# Patient Record
Sex: Female | Born: 1950 | Race: White | Hispanic: No | Marital: Married | Smoking: Never smoker
Health system: Southern US, Community
[De-identification: ages and names within clinical notes are randomized; demographics above are authoritative.]

## PROBLEM LIST (undated history)

## (undated) DIAGNOSIS — I479 Paroxysmal tachycardia, unspecified: Secondary | ICD-10-CM

## (undated) DIAGNOSIS — I10 Essential (primary) hypertension: Secondary | ICD-10-CM

## (undated) DIAGNOSIS — C4491 Basal cell carcinoma of skin, unspecified: Secondary | ICD-10-CM

## (undated) DIAGNOSIS — M797 Fibromyalgia: Secondary | ICD-10-CM

## (undated) DIAGNOSIS — G4700 Insomnia, unspecified: Secondary | ICD-10-CM

## (undated) DIAGNOSIS — T8859XA Other complications of anesthesia, initial encounter: Secondary | ICD-10-CM

## (undated) DIAGNOSIS — N952 Postmenopausal atrophic vaginitis: Secondary | ICD-10-CM

## (undated) HISTORY — DX: Postmenopausal atrophic vaginitis: N95.2

## (undated) HISTORY — PX: COLPOSCOPY: SHX161

## (undated) HISTORY — DX: Basal cell carcinoma of skin, unspecified: C44.91

## (undated) HISTORY — DX: Fibromyalgia: M79.7

## (undated) HISTORY — DX: Paroxysmal tachycardia, unspecified: I47.9

## (undated) HISTORY — DX: Insomnia, unspecified: G47.00

## (undated) HISTORY — PX: BASAL CELL CARCINOMA EXCISION: SHX1214

---
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
## 1994-01-21 HISTORY — PX: REFRACTIVE SURGERY: SHX103

## 2002-01-21 HISTORY — PX: BLEPHAROPLASTY: SUR158

## 2003-01-22 HISTORY — PX: ENDOMETRIAL ABLATION: SHX621

## 2003-03-08 ENCOUNTER — Encounter: Admission: RE | Admit: 2003-03-08 | Discharge: 2003-03-08 | Payer: Self-pay | Admitting: Obstetrics and Gynecology

## 2003-10-13 ENCOUNTER — Ambulatory Visit (HOSPITAL_BASED_OUTPATIENT_CLINIC_OR_DEPARTMENT_OTHER): Admission: RE | Admit: 2003-10-13 | Discharge: 2003-10-13 | Payer: Self-pay | Admitting: Obstetrics and Gynecology

## 2003-12-23 ENCOUNTER — Ambulatory Visit (HOSPITAL_COMMUNITY): Admission: RE | Admit: 2003-12-23 | Discharge: 2003-12-23 | Payer: Self-pay | Admitting: Gastroenterology

## 2003-12-23 ENCOUNTER — Encounter (INDEPENDENT_AMBULATORY_CARE_PROVIDER_SITE_OTHER): Payer: Self-pay | Admitting: *Deleted

## 2004-05-07 ENCOUNTER — Encounter: Admission: RE | Admit: 2004-05-07 | Discharge: 2004-05-07 | Payer: Self-pay | Admitting: Obstetrics and Gynecology

## 2005-04-26 ENCOUNTER — Encounter: Admission: RE | Admit: 2005-04-26 | Discharge: 2005-04-26 | Payer: Self-pay | Admitting: Family Medicine

## 2005-07-01 ENCOUNTER — Encounter: Admission: RE | Admit: 2005-07-01 | Discharge: 2005-07-01 | Payer: Self-pay | Admitting: Ophthalmology

## 2005-07-02 ENCOUNTER — Encounter: Admission: RE | Admit: 2005-07-02 | Discharge: 2005-07-02 | Payer: Self-pay | Admitting: Obstetrics and Gynecology

## 2007-05-22 DIAGNOSIS — N952 Postmenopausal atrophic vaginitis: Secondary | ICD-10-CM

## 2007-05-22 HISTORY — DX: Postmenopausal atrophic vaginitis: N95.2

## 2007-06-01 ENCOUNTER — Other Ambulatory Visit: Admission: RE | Admit: 2007-06-01 | Discharge: 2007-06-01 | Payer: Self-pay | Admitting: Obstetrics and Gynecology

## 2007-06-22 ENCOUNTER — Encounter: Admission: RE | Admit: 2007-06-22 | Discharge: 2007-06-22 | Payer: Self-pay | Admitting: Obstetrics and Gynecology

## 2008-07-11 HISTORY — PX: COLPOSCOPY W/ BIOPSY / CURETTAGE: SUR283

## 2008-07-18 ENCOUNTER — Encounter: Admission: RE | Admit: 2008-07-18 | Discharge: 2008-07-18 | Payer: Self-pay | Admitting: Obstetrics & Gynecology

## 2009-08-01 ENCOUNTER — Encounter: Admission: RE | Admit: 2009-08-01 | Discharge: 2009-08-01 | Payer: Self-pay | Admitting: Obstetrics & Gynecology

## 2009-08-08 ENCOUNTER — Encounter: Admission: RE | Admit: 2009-08-08 | Discharge: 2009-08-08 | Payer: Self-pay | Admitting: Obstetrics & Gynecology

## 2009-12-06 LAB — HM COLONOSCOPY

## 2010-02-11 ENCOUNTER — Encounter: Payer: Self-pay | Admitting: Obstetrics & Gynecology

## 2010-06-08 NOTE — Op Note (Signed)
NAMEMONTRICE, MONTUORI                 ACCOUNT NO.:  [id number]   MEDICAL RECORD NO.:  [id number]          PATIENT TYPE:  AMB   LOCATION:  [facility]                         FACILITY:  [facility]   PHYSICIAN:  Katherine Roan, M.D.  DATE OF BIRTH:  Jun 15, 1950   DATE OF PROCEDURE:  10/13/2003  DATE OF DISCHARGE:                                 OPERATIVE REPORT   PREOPERATIVE DIAGNOSIS:  Menorrhagia.   POSTOPERATIVE DIAGNOSIS:  Menorrhagia.   OPERATION:  Pelvic examination under anesthesia, cervical dilatation,  hysteroscopy with hydrothermal ablation.   The patient was placed in the lithotomy position.  General anesthesia  instituted.  Exam under anesthesia revealed an anterior, slightly enlarged  uterus with no adnexal masses.  Prep and drape in the usual fashion.  The  bladder was emptied.  The cervix was then grasped with a tenaculum and  dilated to a #25 dilator, and the hysteroscope was inserted into the uterus.  The uterine cavity was smooth.  There were 2-3 small polyps.  We had noticed  these on the sonohystogram.  Then a hydrothermal ablation was accomplished  without difficulty.  Pictures were taken pre- and postop.  Ms. Hogston  tolerated this procedure well.      SDM/MEDQ  D:  10/13/2003  T:  10/13/2003  Job:  [id number]

## 2010-08-27 ENCOUNTER — Other Ambulatory Visit: Payer: Self-pay | Admitting: Obstetrics & Gynecology

## 2010-08-27 DIAGNOSIS — Z1231 Encounter for screening mammogram for malignant neoplasm of breast: Secondary | ICD-10-CM

## 2010-09-05 ENCOUNTER — Ambulatory Visit
Admission: RE | Admit: 2010-09-05 | Discharge: 2010-09-05 | Disposition: A | Discharge status: Home / Self Care | Payer: BC Managed Care – PPO | Source: Ambulatory Visit | Attending: Obstetrics & Gynecology | Admitting: Obstetrics & Gynecology

## 2010-09-05 DIAGNOSIS — Z1231 Encounter for screening mammogram for malignant neoplasm of breast: Secondary | ICD-10-CM

## 2011-08-01 ENCOUNTER — Other Ambulatory Visit: Payer: Self-pay | Admitting: Obstetrics & Gynecology

## 2011-08-01 DIAGNOSIS — Z1231 Encounter for screening mammogram for malignant neoplasm of breast: Secondary | ICD-10-CM

## 2011-09-09 ENCOUNTER — Ambulatory Visit
Admission: RE | Admit: 2011-09-09 | Discharge: 2011-09-09 | Disposition: A | Discharge status: Home / Self Care | Payer: BC Managed Care – PPO | Source: Ambulatory Visit | Attending: Obstetrics & Gynecology | Admitting: Obstetrics & Gynecology

## 2011-09-09 DIAGNOSIS — Z1231 Encounter for screening mammogram for malignant neoplasm of breast: Secondary | ICD-10-CM

## 2011-10-11 ENCOUNTER — Other Ambulatory Visit: Payer: Self-pay | Admitting: Physician Assistant

## 2012-04-28 ENCOUNTER — Telehealth: Payer: Self-pay | Admitting: Nurse Practitioner

## 2012-04-28 ENCOUNTER — Other Ambulatory Visit: Payer: Self-pay | Admitting: *Deleted

## 2012-04-28 MED ORDER — ESTRADIOL 0.1 MG/GM VA CREA: 2.0000 g | TOPICAL_CREAM | Freq: Every day | VAGINAL | Status: DC

## 2012-04-28 NOTE — Telephone Encounter (Signed)
OK to refill through September 2014

## 2012-04-28 NOTE — Telephone Encounter (Signed)
PT WOULD LIKE A REFILL ON THE ESTRACE CREAM SENT TO [facility] AT [phone].

## 2012-05-03 ENCOUNTER — Other Ambulatory Visit: Payer: Self-pay | Admitting: Nurse Practitioner

## 2012-07-01 ENCOUNTER — Ambulatory Visit (INDEPENDENT_AMBULATORY_CARE_PROVIDER_SITE_OTHER): Payer: BC Managed Care – PPO | Admitting: Nurse Practitioner

## 2012-07-01 ENCOUNTER — Telehealth: Payer: Self-pay | Admitting: Nurse Practitioner

## 2012-07-01 ENCOUNTER — Encounter: Payer: Self-pay | Admitting: Nurse Practitioner

## 2012-07-01 VITALS — BP 118/70 | HR 64 | Resp 14 | Ht 66.75 in | Wt 151.0 lb

## 2012-07-01 DIAGNOSIS — R102 Pelvic and perineal pain: Secondary | ICD-10-CM

## 2012-07-01 DIAGNOSIS — N949 Unspecified condition associated with female genital organs and menstrual cycle: Secondary | ICD-10-CM

## 2012-07-01 LAB — POCT URINALYSIS DIPSTICK
Leukocytes, UA: NEGATIVE
Urobilinogen, UA: NEGATIVE

## 2012-07-01 MED ORDER — CEPHALEXIN 500 MG PO CAPS: 500.0000 mg | ORAL_CAPSULE | Freq: Two times a day (BID) | ORAL | Status: DC

## 2012-07-01 NOTE — Progress Notes (Signed)
Subjective:     Patient ID: MALAI LADY, female   DOB: 09/26/1950, 63 y.o.   MRN: [id number]  HPI 62 yo WM Fe with history of chafing and irritation on left vulva area for past few days. She feels like it got irritated after shaving.  Then noted a cyst area with slight discoloration. Tender to palpation without exudate.  No recent trauma or aggressive sexual intercourse. No urinary symptoms. She does experience some lower pelvic pressure.   Review of Systems  Constitutional: Positive for chills. Negative for fever and fatigue.  Respiratory: Negative.   Cardiovascular: Negative.   Gastrointestinal: Negative.  Negative for nausea, vomiting, abdominal pain and diarrhea.  Genitourinary: Positive for vaginal pain. Negative for dysuria, urgency, frequency, flank pain, difficulty urinating and pelvic pain.  Neurological: Negative for dizziness, syncope and numbness.  Psychiatric/Behavioral: Negative.        Objective:   Physical Exam  Constitutional: She is oriented to person, place, and time. She appears well-developed and well-nourished.  Cardiovascular: Normal rate.   Pulmonary/Chest: Effort normal.  Abdominal: Soft. She exhibits no distension and no mass. There is no tenderness. There is no rebound and no guarding.  Genitourinary:     Let vulva with a 1/2 cm sebaceous cyst with a bluish discoloration like a blood blister. Minimal if any exudate that is clear to pink tinged. Very  Slight swelling.  Neurological: She is alert and oriented to person, place, and time.  Psychiatric: She has a normal mood and affect. Her behavior is normal. Judgment and thought content normal.       Assessment:     sebaceous cyst    Plan:     Started on Keflex 500 mg bid for next 5-7 days. Warm compresses and sitz bath prn. If area becomes larger or more painful to let us know.  most likely will resolve on its own.

## 2012-07-01 NOTE — Patient Instructions (Signed)

## 2012-07-01 NOTE — Telephone Encounter (Signed)
Left message on CB# of need to return call to office for appointment today.

## 2012-07-01 NOTE — Telephone Encounter (Signed)
Patient is having some burning and cramping, pt would ike appointment with Madison Carr

## 2012-07-02 ENCOUNTER — Encounter: Payer: Self-pay | Admitting: Nurse Practitioner

## 2012-07-02 MED ORDER — CEPHALEXIN 500 MG PO CAPS: 500.0000 mg | ORAL_CAPSULE | Freq: Two times a day (BID) | ORAL | Status: DC

## 2012-07-02 NOTE — Progress Notes (Signed)
Reviewed personally.  M. Suzanne Penn Grissett, MD.  

## 2012-07-03 LAB — URINE CULTURE: Colony Count: 2000

## 2012-07-06 ENCOUNTER — Telehealth: Payer: Self-pay | Admitting: *Deleted

## 2012-07-06 NOTE — Telephone Encounter (Signed)
Message copied by Osie Bond on Mon Jul 06, 2012 11:36 AM ------      Message from: Ria Comment R      Created: Mon Jul 06, 2012  8:33 AM       Let patient know urine C & S is negative ------

## 2012-07-06 NOTE — Telephone Encounter (Signed)
LVM for pt to return call in regards to lab results.

## 2012-08-28 ENCOUNTER — Other Ambulatory Visit: Payer: Self-pay

## 2012-08-28 DIAGNOSIS — Z1231 Encounter for screening mammogram for malignant neoplasm of breast: Secondary | ICD-10-CM

## 2012-09-13 ENCOUNTER — Other Ambulatory Visit: Payer: Self-pay | Admitting: Nurse Practitioner

## 2012-09-14 ENCOUNTER — Other Ambulatory Visit: Payer: Self-pay

## 2012-09-14 MED ORDER — AMITRIPTYLINE HCL 10 MG PO TABS: 10.0000 mg | ORAL_TABLET | Freq: Every day | ORAL | Status: DC

## 2012-09-14 NOTE — Telephone Encounter (Signed)
I left this patient a message to call back to schedule AEX after 8//29/14. Last one was 09/19/11, was given rx for amitriptyline 3 months/3RF. Is ok to refill this for 3 months, please advise. Chart is on your desk.

## 2012-09-14 NOTE — Telephone Encounter (Signed)
Yes Ok for a 3 month refill

## 2012-09-14 NOTE — Telephone Encounter (Signed)
This refill has been sent to pharmacy. See refill encounter.

## 2012-09-16 ENCOUNTER — Ambulatory Visit
Admission: RE | Admit: 2012-09-16 | Discharge: 2012-09-16 | Disposition: A | Discharge status: Home / Self Care | Payer: BC Managed Care – PPO | Source: Ambulatory Visit

## 2012-09-16 DIAGNOSIS — Z1231 Encounter for screening mammogram for malignant neoplasm of breast: Secondary | ICD-10-CM

## 2012-09-22 ENCOUNTER — Ambulatory Visit (INDEPENDENT_AMBULATORY_CARE_PROVIDER_SITE_OTHER): Payer: BC Managed Care – PPO | Admitting: Nurse Practitioner

## 2012-09-22 ENCOUNTER — Encounter: Payer: Self-pay | Admitting: Nurse Practitioner

## 2012-09-22 ENCOUNTER — Ambulatory Visit: Payer: BC Managed Care – PPO | Admitting: Nurse Practitioner

## 2012-09-22 VITALS — BP 120/70 | HR 68 | Resp 16 | Ht 66.75 in | Wt 149.0 lb

## 2012-09-22 DIAGNOSIS — E559 Vitamin D deficiency, unspecified: Secondary | ICD-10-CM

## 2012-09-22 DIAGNOSIS — Z01419 Encounter for gynecological examination (general) (routine) without abnormal findings: Secondary | ICD-10-CM

## 2012-09-22 DIAGNOSIS — Z0000 Encounter for general adult medical examination without abnormal findings: Secondary | ICD-10-CM

## 2012-09-22 LAB — HEMOGLOBIN, FINGERSTICK: Hemoglobin, fingerstick: 14.1 g/dL (ref 12.0–16.0)

## 2012-09-22 MED ORDER — ESTRADIOL 10 MCG VA TABS: 10.0000 ug | ORAL_TABLET | VAGINAL | Status: DC

## 2012-09-22 MED ORDER — AMITRIPTYLINE HCL 10 MG PO TABS: 10.0000 mg | ORAL_TABLET | Freq: Every day | ORAL | Status: DC

## 2012-09-22 MED ORDER — ESTRADIOL 0.1 MG/GM VA CREA: TOPICAL_CREAM | VAGINAL | Status: DC

## 2012-09-22 MED ORDER — ESTRADIOL-NORETHINDRONE ACET 0.5-0.1 MG PO TABS: 1.0000 | ORAL_TABLET | Freq: Every day | ORAL | Status: DC

## 2012-09-22 NOTE — Progress Notes (Signed)
Patient ID: Madison Carr, female   DOB: [birth year], 62 y.o.   MRN: [id number] 62 y.o. [id number] Married Caucasian Fe here for annual exam.  Daughter who Nurse Anesthetist is expecting second baby in March, having a lot of nausea issues.  Patient's last menstrual period was 11/22/2003.          Sexually active: yes  The current method of family planning is post menopausal status.    Exercising: yes  Gym/ health club routine includes walking on track  and eliptical and tennis.. Smoker:  no  Health Maintenance: Pap:  09/19/11, ASCUS, neg HR HPV MMG:  09/16/12, BI-Rads 1: negative Colonoscopy:  12/07/11, 3 polyp, repeat in 3 years BMD:   6.1/09, osteopenia TDaP:  unknown Labs: HB:   Urine: 1+ leuk's, pH 7.0   reports that she has never smoked. She has never used smokeless tobacco. She reports that she drinks about 2.5 ounces of alcohol per week. She reports that she does not use illicit drugs.  Past Medical History  Diagnosis Date  . Fibromyalgia   . Insomnia   . Basal cell carcinoma     nose  . Postmenopausal atrophic vaginitis 5/09    Past Surgical History  Procedure Laterality Date  . Tonsillectomy and adenoidectomy  1968  . Endometrial ablation  2005  . Basal cell carcinoma excision  2008 ?    nose    Current Outpatient Prescriptions  Medication Sig Dispense Refill  . amitriptyline (ELAVIL) 10 MG tablet Take 1 tablet (10 mg total) by mouth at bedtime.  90 tablet  3  . estradiol (ESTRACE VAGINAL) 0.1 MG/GM vaginal cream APPLY TWICE A WEEK AS DIRECTED  42.5 g  3  . Estradiol (VAGIFEM) 10 MCG TABS vaginal tablet Place 1 tablet (10 mcg total) vaginally 2 (two) times a week.  24 tablet  3  . Estradiol-Norethindrone Acet (ACTIVELLA) 0.5-0.1 MG per tablet Take 1 tablet by mouth daily.  90 tablet  3  . LORazepam (ATIVAN) 1 MG tablet Take 1 mg by mouth as needed.        No current facility-administered medications for this visit.    Family History  Problem Relation Age of Onset  .  Melanoma Mother 25    left leg  . Heart disease Father     macular deg.  . Macular degeneration Father   . Macular degeneration Paternal Aunt   . Macular degeneration Paternal Uncle     ROS:  Pertinent items are noted in HPI.  Otherwise, a comprehensive ROS was negative.  Exam:   BP 120/70  Pulse 68  Resp 16  Ht 5' 6.75" (1.695 m)  Wt 149 lb (67.586 kg)  BMI 23.52 kg/m2  LMP 11/22/2003 Height: 5' 6.75" (169.5 cm)  Ht Readings from Last 3 Encounters:  09/22/12 5' 6.75" (1.695 m)  07/01/12 5' 6.75" (1.695 m)    General appearance: alert, cooperative and appears stated age Head: Normocephalic, without obvious abnormality, atraumatic Neck: no adenopathy, supple, symmetrical, trachea midline and thyroid normal to inspection and palpation Lungs: clear to auscultation bilaterally Breasts: normal appearance, no masses or tenderness Heart: regular rate and rhythm Abdomen: soft, non-tender; no masses,  no organomegaly Extremities: extremities normal, atraumatic, no cyanosis or edema Skin: Skin color, texture, turgor normal. No rashes or lesions Lymph nodes: Cervical, supraclavicular, and axillary nodes normal. No abnormal inguinal nodes palpated Neurologic: Grossly normal   Pelvic: External genitalia:  no lesions  Urethra:  normal appearing urethra with no masses, tenderness or lesions, improved with estrogen treatments              Bartholin's and Skene's: normal                 Vagina: normal appearing vagina with normal color and discharge, no lesions              Cervix: anteverted              Pap taken: no Bimanual Exam:  Uterus:  normal size, contour, position, consistency, mobility, non-tender              Adnexa: no mass, fullness, tenderness               Rectovaginal: Confirms               Anus:  normal sphincter tone, no lesions  A:  Well Woman with normal exam  Postmenopausal on HRT - tapered to 1/2 tablet  Atrophic vaginitis on Vagifem  History of  ASCUS with negative HR HPV 06/2008 - normal since.  History of fibromyalgia, insomnia, and vit D deficiency.   History of prolapse urethra - better with pea size of estrace 2 times weekly  P:   Pap smear as per guidelines   Mammogram due 8/15  Refill on HRT, Vagifem, and Elavil (samples of Vagifem)  Counseled with risk of HRT including DVT, CVA, cancer, etc.  Counseled on breast self exam, adequate intake of calcium and vitamin D, diet and exercise return annually or prn  An After Visit Summary was printed and given to the patient.

## 2012-09-22 NOTE — Patient Instructions (Signed)

## 2012-09-26 NOTE — Progress Notes (Signed)
Encounter reviewed by Dr. Brook Silva.  

## 2012-11-26 ENCOUNTER — Other Ambulatory Visit: Payer: Self-pay

## 2013-02-02 ENCOUNTER — Other Ambulatory Visit: Payer: Self-pay | Admitting: Family Medicine

## 2013-02-02 DIAGNOSIS — N644 Mastodynia: Secondary | ICD-10-CM

## 2013-02-12 ENCOUNTER — Ambulatory Visit
Admission: RE | Admit: 2013-02-12 | Discharge: 2013-02-12 | Disposition: A | Discharge status: Home / Self Care | Payer: BC Managed Care – PPO | Source: Ambulatory Visit | Attending: Family Medicine | Admitting: Family Medicine

## 2013-02-12 ENCOUNTER — Other Ambulatory Visit: Payer: Self-pay | Admitting: Family Medicine

## 2013-02-12 DIAGNOSIS — N644 Mastodynia: Secondary | ICD-10-CM

## 2013-04-20 ENCOUNTER — Ambulatory Visit (INDEPENDENT_AMBULATORY_CARE_PROVIDER_SITE_OTHER): Payer: BC Managed Care – PPO

## 2013-04-20 ENCOUNTER — Ambulatory Visit (INDEPENDENT_AMBULATORY_CARE_PROVIDER_SITE_OTHER): Payer: BC Managed Care – PPO | Admitting: Podiatry

## 2013-04-20 ENCOUNTER — Encounter: Payer: Self-pay | Admitting: Podiatry

## 2013-04-20 VITALS — BP 134/69 | HR 79 | Resp 15 | Ht 67.0 in | Wt 145.0 lb

## 2013-04-20 DIAGNOSIS — G588 Other specified mononeuropathies: Secondary | ICD-10-CM

## 2013-04-20 DIAGNOSIS — G5760 Lesion of plantar nerve, unspecified lower limb: Secondary | ICD-10-CM

## 2013-04-20 DIAGNOSIS — M79609 Pain in unspecified limb: Secondary | ICD-10-CM

## 2013-04-20 NOTE — Progress Notes (Signed)
   Subjective:    Patient ID: Madison Carr, female    DOB: 1951/01/18, 63 y.o.   MRN: [id number]  HPI N foot pain        L Left forefoot 2, 3, 4, 5 MPJ        D Saturday 04/17/2013        O         C began as a cramping sensation, now stabbing        A walking, and worse barefooted        T OTC orthotic, ice    Review of Systems  Constitutional: Negative.   HENT: Positive for congestion, postnasal drip, rhinorrhea and sinus pressure.   Eyes: Negative.   Respiratory: Negative.   Cardiovascular: Negative.   Gastrointestinal: Negative.   Endocrine: Negative.   Genitourinary: Negative.   Musculoskeletal: Negative.   Skin: Negative.   Allergic/Immunologic:       Possible environmental allergy symptoms now.  Neurological: Negative.   Hematological: Negative.   Psychiatric/Behavioral: Negative.        Objective:   Physical Exam: I have reviewed her past medical history medications allergies surgeries and social history. Pulses are palpable bilateral. Neurologic sensorium is intact per Semmes-Weinstein monofilament. He tendon reflexes are intact bilateral muscle strength is 5 over 5 dorsiflexors plantar flexors inverters everters all intrinsic musculature is intact. Orthopedic evaluation demonstrates no pain about palpation or range of motion of the foot with exception of the third interdigital space of the left foot. Radiographic evaluation does not demonstrate any type of osseous abnormalities in these areas        Assessment & Plan:  Assessment: Consistent with neuroma third interdigital space left foot.  Plan: Injected the third interdigital space of the left foot with Kenalog and local anesthetic. Discussed appropriate shoe gear stretching exercises ice therapy and shoe gear modifications. Discussed the possible need for. And discussed the use of alcohol injections.

## 2013-04-22 ENCOUNTER — Ambulatory Visit: Payer: BC Managed Care – PPO | Admitting: Podiatry

## 2013-05-10 ENCOUNTER — Telehealth: Payer: Self-pay | Admitting: *Deleted

## 2013-05-10 DIAGNOSIS — G5760 Lesion of plantar nerve, unspecified lower limb: Secondary | ICD-10-CM

## 2013-05-10 NOTE — Telephone Encounter (Signed)
Calling to see how I arrange to get my x-rays copied from 04/20/13.  Can they be mailed or do I come and pick them up?  I called and left her a message that she would have to come by to pick it up and there's a $5 charge.

## 2013-05-11 ENCOUNTER — Ambulatory Visit: Payer: BC Managed Care – PPO | Admitting: Podiatry

## 2013-06-11 ENCOUNTER — Other Ambulatory Visit: Payer: Self-pay | Admitting: Physician Assistant

## 2013-10-04 ENCOUNTER — Other Ambulatory Visit: Payer: Self-pay | Admitting: Nurse Practitioner

## 2013-10-04 NOTE — Telephone Encounter (Signed)
Last AEX: 09/22/12 Last refill:09/22/12 #24 X 3 Current AEX:NS Last MMG: 09/16/12 Bi-Rads Neg  Please advise

## 2013-10-05 NOTE — Telephone Encounter (Signed)
AEX made on 10/15/13 Encounter closed

## 2013-10-15 ENCOUNTER — Other Ambulatory Visit: Payer: Self-pay

## 2013-10-15 ENCOUNTER — Encounter: Payer: Self-pay | Admitting: Nurse Practitioner

## 2013-10-15 ENCOUNTER — Ambulatory Visit (INDEPENDENT_AMBULATORY_CARE_PROVIDER_SITE_OTHER): Payer: BC Managed Care – PPO | Admitting: Nurse Practitioner

## 2013-10-15 VITALS — BP 134/72 | HR 68 | Ht 67.0 in | Wt 145.0 lb

## 2013-10-15 DIAGNOSIS — Z0000 Encounter for general adult medical examination without abnormal findings: Secondary | ICD-10-CM

## 2013-10-15 DIAGNOSIS — Z1231 Encounter for screening mammogram for malignant neoplasm of breast: Secondary | ICD-10-CM

## 2013-10-15 DIAGNOSIS — Z01419 Encounter for gynecological examination (general) (routine) without abnormal findings: Secondary | ICD-10-CM

## 2013-10-15 DIAGNOSIS — M899 Disorder of bone, unspecified: Secondary | ICD-10-CM

## 2013-10-15 DIAGNOSIS — M949 Disorder of cartilage, unspecified: Secondary | ICD-10-CM

## 2013-10-15 DIAGNOSIS — R82998 Other abnormal findings in urine: Secondary | ICD-10-CM

## 2013-10-15 DIAGNOSIS — M8580 Other specified disorders of bone density and structure, unspecified site: Secondary | ICD-10-CM

## 2013-10-15 DIAGNOSIS — R8290 Unspecified abnormal findings in urine: Secondary | ICD-10-CM

## 2013-10-15 LAB — POCT URINALYSIS DIPSTICK
Bilirubin, UA: NEGATIVE
Glucose, UA: NEGATIVE
Ketones, UA: NEGATIVE
Nitrite, UA: NEGATIVE
PH UA: 7
PROTEIN UA: NEGATIVE
RBC UA: NEGATIVE
UROBILINOGEN UA: NEGATIVE

## 2013-10-15 LAB — HEMOGLOBIN, FINGERSTICK: Hemoglobin, fingerstick: 13.2 g/dL (ref 12.0–16.0)

## 2013-10-15 MED ORDER — AMITRIPTYLINE HCL 10 MG PO TABS: 10.0000 mg | ORAL_TABLET | Freq: Every day | ORAL | Status: DC

## 2013-10-15 MED ORDER — ESTRADIOL-NORETHINDRONE ACET 0.5-0.1 MG PO TABS: ORAL_TABLET | ORAL | Status: DC

## 2013-10-15 MED ORDER — ESTRADIOL 10 MCG VA TABS: 1.0000 | ORAL_TABLET | VAGINAL | Status: DC

## 2013-10-15 MED ORDER — ESTRADIOL 0.1 MG/GM VA CREA: TOPICAL_CREAM | VAGINAL | Status: DC

## 2013-10-15 NOTE — Patient Instructions (Addendum)

## 2013-10-15 NOTE — Progress Notes (Signed)
Patient ID: Madison Carr, female   DOB: 1950-09-14, 63 y.o.   MRN: [id number] 63 y.o. [id number] Married Caucasian Fe here for annual exam.  Daughter now has 2 children. Son just got remarried.  Also decided to retire this year.  Husband is supportive.  Her sleep is better with the Elavil.  Patient's last menstrual period was 11/22/2003.          Sexually active: yes  The current method of family planning is post menopausal status.  Exercising: yes Gym/ health club routine includes walking on track and elliptical and tennis..  Smoker: no   Health Maintenance:  Pap: 09/19/11, ASCUS, neg HR HPV  MMG:  02/12/13, diagnostic; Bi-Rads 1:  Negative;  09/16/12, BI-Rads 1: negative, scheduled 10/26/13 Colonoscopy: 12/06/09, 3 polyp, repeat in 3 years will schedule BMD: 6.1/09, osteopenia  TDaP: unknown  Labs:  HB:  13.2  Urine:  1+ leuk's - no symptoms   reports that she has never smoked. She has never used smokeless tobacco. She reports that she drinks about 2.5 ounces of alcohol per week. She reports that she does not use illicit drugs.  Past Medical History  Diagnosis Date  . Fibromyalgia   . Insomnia   . Basal cell carcinoma     nose  . Postmenopausal atrophic vaginitis 5/09    Past Surgical History  Procedure Laterality Date  . Tonsillectomy and adenoidectomy  1968  . Endometrial ablation  2005  . Basal cell carcinoma excision  2008 ?    nose    Current Outpatient Prescriptions  Medication Sig Dispense Refill  . amitriptyline (ELAVIL) 10 MG tablet Take 1 tablet (10 mg total) by mouth at bedtime.  90 tablet  3  . CALCIUM PO Take by mouth daily.      . cholecalciferol (VITAMIN D) 1000 UNITS tablet Take 1,000 Units by mouth daily.      . Cyanocobalamin (VITAMIN B-12 PO) Take by mouth daily.      Marland Kitchen estradiol (ESTRACE VAGINAL) 0.1 MG/GM vaginal cream APPLY TWICE A WEEK AS DIRECTED  42.5 g  3  . Estradiol (VAGIFEM) 10 MCG TABS vaginal tablet Place 1 tablet (10 mcg total) vaginally 2 (two)  times a week.  8 tablet  12  . Estradiol-Norethindrone Acet 0.5-0.1 MG per tablet Take 1/2 tablet daily  30 tablet  12  . LORazepam (ATIVAN) 1 MG tablet Take 1 mg by mouth as needed.        No current facility-administered medications for this visit.    Family History  Problem Relation Age of Onset  . Melanoma Mother 25    left leg  . Heart disease Father     macular deg.  . Macular degeneration Father   . Macular degeneration Paternal Aunt   . Macular degeneration Paternal Uncle     ROS:  Pertinent items are noted in HPI.  Otherwise, a comprehensive ROS was negative.  Exam:   BP 134/72  Pulse 68  Ht 5\' 7"  (1.702 m)  Wt 145 lb (65.772 kg)  BMI 22.71 kg/m2  LMP 11/22/2003 Height: 5\' 7"  (170.2 cm)  Ht Readings from Last 3 Encounters:  10/15/13 5\' 7"  (1.702 m)  04/20/13 5\' 7"  (1.702 m)  09/22/12 5' 6.75" (1.695 m)    General appearance: alert, cooperative and appears stated age Head: Normocephalic, without obvious abnormality, atraumatic Neck: no adenopathy, supple, symmetrical, trachea midline and thyroid normal to inspection and palpation Lungs: clear to auscultation bilaterally Breasts: normal appearance, no masses  or tenderness Heart: regular rate and rhythm Abdomen: soft, non-tender; no masses,  no organomegaly Extremities: extremities normal, atraumatic, no cyanosis or edema Skin: Skin color, texture, turgor normal. No rashes or lesions Lymph nodes: Cervical, supraclavicular, and axillary nodes normal. No abnormal inguinal nodes palpated Neurologic: Grossly normal   Pelvic: External genitalia:  no lesions              Urethra:  normal appearing urethra with no masses, tenderness or lesions              Bartholin's and Skene's: normal                 Vagina: normal appearing vagina with normal color and discharge, no lesions              Cervix: anteverted              Pap taken: Yes.   Bimanual Exam:  Uterus:  normal size, contour, position, consistency,  mobility, non-tender              Adnexa: no mass, fullness, tenderness               Rectovaginal: Confirms               Anus:  normal sphincter tone, no lesions  A:  Well Woman with normal exam  Postmenopausal on HRT - tapered to 1/2 tablet   Atrophic vaginitis on Vagifem   History of ASCUS with negative HR HPV 06/2008 - normal since.   History of fibromyalgia, insomnia, and vit D deficiency.   History of prolapse urethra - better with pea size of Estrace 2 times weekly  R/O UTI  P:   Reviewed health and wellness pertinent to exam  Pap smear taken today  Mammogram is due 10/15 and is scheduled  Refill on Elavil 10 mg daily at he for a year  Refill on Estrace vaginal cream a pea size amount at the urethra twice weekly  Refill on Vagifem 10 mg twice weekly for a year  Refill on Estradiol-Norethindrone 0.5-0.1 mg 1/2 tablet daily for a year  Counseled with risk of DVT, CVA, cancer, etc.  She may also try to reduce to every other day.  Counseled on breast self exam, mammography screening, use and side effects of HRT, adequate intake of calcium and vitamin D, diet and exercise, Kegel's exercises return annually or prn  An After Visit Summary was printed and given to the patient.

## 2013-10-16 LAB — URINE CULTURE
Colony Count: NO GROWTH
Organism ID, Bacteria: NO GROWTH

## 2013-10-16 NOTE — Progress Notes (Signed)
Encounter reviewed by Dr. Brook Silva.  

## 2013-10-20 LAB — IPS PAP TEST WITH HPV

## 2013-10-26 ENCOUNTER — Ambulatory Visit
Admission: RE | Admit: 2013-10-26 | Discharge: 2013-10-26 | Disposition: A | Discharge status: Home / Self Care | Payer: BC Managed Care – PPO | Source: Ambulatory Visit | Attending: Nurse Practitioner | Admitting: Nurse Practitioner

## 2013-10-26 ENCOUNTER — Ambulatory Visit: Payer: BC Managed Care – PPO

## 2013-10-26 ENCOUNTER — Ambulatory Visit
Admission: RE | Admit: 2013-10-26 | Discharge: 2013-10-26 | Disposition: A | Discharge status: Home / Self Care | Payer: BC Managed Care – PPO | Source: Ambulatory Visit

## 2013-10-26 DIAGNOSIS — M8580 Other specified disorders of bone density and structure, unspecified site: Secondary | ICD-10-CM

## 2013-10-26 DIAGNOSIS — Z1231 Encounter for screening mammogram for malignant neoplasm of breast: Secondary | ICD-10-CM

## 2013-11-03 ENCOUNTER — Other Ambulatory Visit: Payer: Self-pay | Admitting: Nurse Practitioner

## 2013-11-03 ENCOUNTER — Telehealth: Payer: Self-pay | Admitting: Nurse Practitioner

## 2013-11-03 NOTE — Telephone Encounter (Signed)
Patient had questions about final pap result. "Marked/Severe inflammation"  Advised normal pap with these results. Could be age related changes, hormonal changes.  Patient agreeable.  Routing to provider for final review. Patient agreeable to disposition. Will close encounter

## 2013-11-03 NOTE — Telephone Encounter (Signed)
Patient has a question regarding her pap result.

## 2013-11-22 ENCOUNTER — Encounter: Payer: Self-pay | Admitting: Nurse Practitioner

## 2014-09-29 ENCOUNTER — Other Ambulatory Visit: Payer: Self-pay

## 2014-09-29 DIAGNOSIS — Z1231 Encounter for screening mammogram for malignant neoplasm of breast: Secondary | ICD-10-CM

## 2014-09-30 ENCOUNTER — Encounter: Payer: Self-pay | Admitting: Nurse Practitioner

## 2014-09-30 ENCOUNTER — Ambulatory Visit (INDEPENDENT_AMBULATORY_CARE_PROVIDER_SITE_OTHER): Payer: BLUE CROSS/BLUE SHIELD | Admitting: Nurse Practitioner

## 2014-09-30 VITALS — BP 108/76 | HR 64 | Ht 66.5 in | Wt 142.0 lb

## 2014-09-30 DIAGNOSIS — R350 Frequency of micturition: Secondary | ICD-10-CM

## 2014-09-30 DIAGNOSIS — N760 Acute vaginitis: Secondary | ICD-10-CM

## 2014-09-30 DIAGNOSIS — R102 Pelvic and perineal pain: Secondary | ICD-10-CM

## 2014-09-30 DIAGNOSIS — M8580 Other specified disorders of bone density and structure, unspecified site: Secondary | ICD-10-CM

## 2014-09-30 DIAGNOSIS — Z0000 Encounter for general adult medical examination without abnormal findings: Secondary | ICD-10-CM | POA: Diagnosis not present

## 2014-09-30 DIAGNOSIS — N9489 Other specified conditions associated with female genital organs and menstrual cycle: Secondary | ICD-10-CM | POA: Diagnosis not present

## 2014-09-30 DIAGNOSIS — Z01419 Encounter for gynecological examination (general) (routine) without abnormal findings: Secondary | ICD-10-CM

## 2014-09-30 LAB — POCT URINALYSIS DIPSTICK
Bilirubin, UA: NEGATIVE
Glucose, UA: NEGATIVE
Ketones, UA: NEGATIVE
NITRITE UA: NEGATIVE
Protein, UA: NEGATIVE
Urobilinogen, UA: NEGATIVE
pH, UA: 6

## 2014-09-30 MED ORDER — ESTRADIOL 10 MCG VA TABS: 1.0000 | ORAL_TABLET | VAGINAL | Status: DC

## 2014-09-30 MED ORDER — AMITRIPTYLINE HCL 10 MG PO TABS: 10.0000 mg | ORAL_TABLET | Freq: Every day | ORAL | Status: DC

## 2014-09-30 MED ORDER — ESTRADIOL-NORETHINDRONE ACET 0.5-0.1 MG PO TABS: ORAL_TABLET | ORAL | Status: DC

## 2014-09-30 MED ORDER — ESTRADIOL 0.1 MG/GM VA CREA: TOPICAL_CREAM | VAGINAL | Status: DC

## 2014-09-30 MED ORDER — PHENYLEPH-PROMETHAZINE-COD 5-6.25-10 MG/5ML PO SYRP: 5.0000 mL | ORAL_SOLUTION | ORAL | Status: DC

## 2014-09-30 NOTE — Progress Notes (Signed)
Patient ID: Madison Carr, female   DOB: 1950/05/03, 64 y.o.   MRN: [id number] 64 y.o. [id number] Married  Caucasian Fe here for annual exam.  She is feeling well.  Would like to stay on HRT at this time but is in process of tapering down her dose.  She does note a pelvic fulness and aching lower pelvis for some time.  'Feelings' are on and off.  Not severe enough to require treatment.  She gets no relief of vaginal estrogen.  Concerned about cancer - good friend recently diagnosed with a 'female cancer' and would like further investigation.  She does have some pain inside the vagina on the left side during SA.  She has had symptoms of URI and most likely seasonal allergies.  She has been awake a lot at night due to coughing -request a refill on cough syrup she had yrs ago that had codeine.  Patient's last menstrual period was 11/22/2003 (approximate).          Sexually active: Yes.    The current method of family planning is post menopausal status.    Exercising: Yes.    Gym/ health club routine includes personal trainer 3 times per week, plays tennis and occasionally walks.  Also chases grandbabies!. Smoker:  no  Health Maintenance: Pap:  10/15/13, Negative with neg HR HPV MMG: 10/26/13, 3D, Bi-Rads 1: Negative; scheduled for 11/07/14 Colonoscopy: 2015, polyps, repeat in 3 years BMD: 10/26/13, T-Score -0.2 S / -1.2 L TDaP:  06/2014 with PCP Labs: HB:  PCP    Urine:  3+ leuk's, trace RBC   reports that she has never smoked. She has never used smokeless tobacco. She reports that she drinks about 2.5 oz of alcohol per week. She reports that she does not use illicit drugs.  Past Medical History  Diagnosis Date  . Fibromyalgia   . Insomnia   . Basal cell carcinoma     nose  . Postmenopausal atrophic vaginitis 5/09    Past Surgical History  Procedure Laterality Date  . Tonsillectomy and adenoidectomy  1968  . Endometrial ablation  2005  . Basal cell carcinoma excision  2008 ?    nose     Current Outpatient Prescriptions  Medication Sig Dispense Refill  . amitriptyline (ELAVIL) 10 MG tablet Take 1 tablet (10 mg total) by mouth at bedtime. 90 tablet 3  . CALCIUM PO Take by mouth daily.    . cholecalciferol (VITAMIN D) 1000 UNITS tablet Take 1,000 Units by mouth daily.    . Cyanocobalamin (VITAMIN B-12 PO) Take 1,000 mg by mouth daily. Vitamin B12    . estradiol (ESTRACE VAGINAL) 0.1 MG/GM vaginal cream APPLY to urethra twice day. 42.5 g 3  . Estradiol (VAGIFEM) 10 MCG TABS vaginal tablet Place 1 tablet (10 mcg total) vaginally 2 (two) times a week. 24 tablet 4  . Estradiol-Norethindrone Acet 0.5-0.1 MG per tablet Take 1/2 tablet daily 90 tablet 4  . LORazepam (ATIVAN) 1 MG tablet Take 1 mg by mouth as needed.     . Multiple Vitamins-Minerals (OCUVITE ADULT 50+) CAPS Take 1 capsule by mouth daily.    Marland Kitchen Phenyleph-Promethazine-Cod 5-6.25-10 MG/5ML SYRP Take 5 mLs by mouth every 4 (four) hours. 118 mL 0   No current facility-administered medications for this visit.    Family History  Problem Relation Age of Onset  . Melanoma Mother 25    left leg  . Heart disease Father     macular deg.  . Macular  degeneration Father   . Macular degeneration Paternal Aunt   . Macular degeneration Paternal Uncle     ROS:  Pertinent items are noted in HPI.  Otherwise, a comprehensive ROS was negative.  Exam:   BP 108/76 mmHg  Pulse 64  Ht 5' 6.5" (1.689 m)  Wt 142 lb (64.411 kg)  BMI 22.58 kg/m2  LMP 11/22/2003 (Approximate) Height: 5' 6.5" (168.9 cm) Ht Readings from Last 3 Encounters:  09/30/14 5' 6.5" (1.689 m)  10/15/13 5\' 7"  (1.702 m)  04/20/13 5\' 7"  (1.702 m)    General appearance: alert, cooperative and appears stated age Head: Normocephalic, without obvious abnormality, atraumatic Neck: no adenopathy, supple, symmetrical, trachea midline and thyroid normal to inspection and palpation Lungs: clear to auscultation bilaterally Breasts: normal appearance, no masses or  tenderness Heart: regular rate and rhythm Abdomen: soft, non-tender; no masses,  no organomegaly Extremities: extremities normal, atraumatic, no cyanosis or edema Skin: Skin color, texture, turgor normal. No rashes or lesions Lymph nodes: Cervical, supraclavicular, and axillary nodes normal. No abnormal inguinal nodes palpated Neurologic: Grossly normal   Pelvic: External genitalia:  no lesions              Urethra:  normal appearing urethra with no masses, tenderness or lesions              Bartholin's and Skene's: normal                 Vagina: normal appearing vagina with normal color and discharge, no lesions.  On the inside left vaginal wall there are 2 fingerlike projections that have a small white head appearance that is tender.              Cervix: anteverted              Pap taken: Yes.   Bimanual Exam:  Uterus:  normal size, contour, position, consistency, mobility, non-tender              Adnexa: no mass, fullness, tenderness               Rectovaginal: Confirms               Anus:  normal sphincter tone, no lesions  Chaperone present: yes  A:  Well Woman with normal exam  Postmenopausal on HRT - tapered to 1/2 tablet  Atrophic vaginitis on Vagifem  History of ASCUS with negative HR HPV 06/2008 - normal since.  History of fibromyalgia, insomnia, and vit D deficiency.  History of prolapse urethra - better with pea size of Estrace 2 times weekly R/O UTI  Left vaginal wall pain  'Pelvic pressure'   P:   Reviewed health and wellness pertinent to exam  Pap smear as above  Mammogram is due 10/16  Refill on Menest, Vagifem, and Estrace cream to urethra  Counseled with risk of DVT, CVA, cancer, etc  Will get PUS scheduled and follow - she saw Dr. Sabra Heck for previous colpo biopsy.  She will ask Dr. Sabra Heck to check the vaginal area as well if the pain is still present - as this may go away if she is treated for  infection.  Will get urine culture and wet prep to see if infection and follow  Rx given for phenergan with codeine for cough with her seasonal allergy and URI - she is aware that if no better to see PCP  Counseled on breast self exam, mammography screening, use and side effects of HRT, adequate intake  of calcium and vitamin D, diet and exercise, Kegel's exercises return annually or prn  An After Visit Summary was printed and given to the patient.

## 2014-10-01 LAB — URINE CULTURE

## 2014-10-01 LAB — WET PREP BY MOLECULAR PROBE
CANDIDA SPECIES: NEGATIVE
GARDNERELLA VAGINALIS: NEGATIVE
TRICHOMONAS VAG: NEGATIVE

## 2014-10-02 NOTE — Progress Notes (Signed)
Reviewed personally.  M. Suzanne Lavenia Stumpo, MD.  

## 2014-10-04 ENCOUNTER — Telehealth: Payer: Self-pay | Admitting: Nurse Practitioner

## 2014-10-04 DIAGNOSIS — R102 Pelvic and perineal pain: Secondary | ICD-10-CM

## 2014-10-04 LAB — IPS PAP TEST WITH HPV

## 2014-10-04 NOTE — Telephone Encounter (Signed)
Spoke with patient. Patient would like to schedule PUS at this time. Appointment scheduled for 9/15 at 3:30 pm with 4 pm consult with Dr.Miller. Aware of arrival time of 3:15 pm. New order placed as she will see Dr.Miller for ultrasound. Will need precert.  Cc: Theresia Lo  Routing to provider for final review. Patient agreeable to disposition. Will close encounter.

## 2014-10-04 NOTE — Telephone Encounter (Signed)
Patient is calling to schedule an ultrasound appointment.

## 2014-10-04 NOTE — Telephone Encounter (Signed)
Left message to call Carlas Vandyne at [phone]. 

## 2014-10-05 ENCOUNTER — Telehealth: Payer: Self-pay | Admitting: Obstetrics & Gynecology

## 2014-10-05 NOTE — Telephone Encounter (Signed)
Called patient to review benefits for procedure. Left voicemail to call back and review. Ok to ask for Lura Em for benefits. Noted in [employer] account.

## 2014-10-05 NOTE — Telephone Encounter (Signed)
Patient advised of benefits and reminded of appointment arrival time. Patient expressed understanding.

## 2014-10-06 ENCOUNTER — Ambulatory Visit (INDEPENDENT_AMBULATORY_CARE_PROVIDER_SITE_OTHER): Payer: BLUE CROSS/BLUE SHIELD

## 2014-10-06 ENCOUNTER — Ambulatory Visit (INDEPENDENT_AMBULATORY_CARE_PROVIDER_SITE_OTHER): Payer: BLUE CROSS/BLUE SHIELD | Admitting: Obstetrics & Gynecology

## 2014-10-06 VITALS — BP 120/76 | HR 64 | Resp 16 | Wt 144.0 lb

## 2014-10-06 DIAGNOSIS — R102 Pelvic and perineal pain: Secondary | ICD-10-CM

## 2014-10-06 DIAGNOSIS — Z79890 Hormone replacement therapy (postmenopausal): Secondary | ICD-10-CM | POA: Diagnosis not present

## 2014-10-06 NOTE — Progress Notes (Signed)
64 y.o. Madison Carr Marriedfemale here for a pelvic ultrasound due to sensation of pelvic fullness and pelvic aches at times.  Pt reports she has a good friend recently diagnosed with a gynecological cancer and she has felt more anxious about her symptoms.  She reports starting to exercise with a trainer last year that has continued since then.  Trainer continues to push her and she has wondered if some of her pelvic sensations is due to her work outs.  Pt states "if my ultrasound is normal, I'm just not going to worry about this any more".   Pt also asks my opinion about HRT.  She is taking 1/2 tab of her HRT.  Advised I think she should continue moving towards being off her HRT.  She states she feels really good right now and not sure she wants to make any changes.  Reminded pt of breast cancer risk, which she has been clearly advised of in the past as well.   Denies any vaginal bleeding.  Patient's last menstrual period was 11/22/2003 (approximate).  Sexually active:  yes  Contraception: PMP  FINDINGS: UTERUS: 7.0 x 4.6 x 3.5cm EMS: 2.51mm ADNEXA:   Left ovary 2.6 x 0.8 x 1.1cm   Right ovary 1.5 x 1.9 x 1.9cm CUL DE SAC: no free fluid  Images reviewed with pt.  No fibroids noted.  No masses on ovaries.  Endometrium is normal.  Pt is reassured.    Assessment:  Pelvic fullness/pelvic achiness HRT use   Plan: For now, will plan follow up with AEX next year.  Pt knows to call back if any of her current symptoms worsen or if she has any bleeding.  Pt voices clear understanding.  All questions answered.    ~15 minutes spent with patient >50% of time was in face to face discussion of above.

## 2014-10-09 ENCOUNTER — Encounter: Payer: Self-pay | Admitting: Obstetrics & Gynecology

## 2014-10-09 DIAGNOSIS — Z79890 Hormone replacement therapy (postmenopausal): Secondary | ICD-10-CM | POA: Insufficient documentation

## 2014-10-10 ENCOUNTER — Ambulatory Visit: Payer: BLUE CROSS/BLUE SHIELD

## 2014-10-10 ENCOUNTER — Telehealth: Payer: Self-pay | Admitting: Obstetrics & Gynecology

## 2014-10-10 MED ORDER — NITROFURANTOIN MONOHYD MACRO 100 MG PO CAPS: 100.0000 mg | ORAL_CAPSULE | Freq: Two times a day (BID) | ORAL | Status: DC

## 2014-10-10 NOTE — Telephone Encounter (Signed)
Message left to return call to Tracy at [phone].    

## 2014-10-10 NOTE — Telephone Encounter (Signed)
Patient calling requesting to speak with the nurse. She said, "I am having just a little vaginal or urethral  irritation and am wondering if they can call something in." Patient declined an appointment.

## 2014-10-10 NOTE — Telephone Encounter (Signed)
Patient returned call. She c/o two weeks of "stinging" pain with urination. Has not worsened over the last two weeks but has not improved. Patient states she cannot tell if it is external or internal irritation. Has been using estrace cream on urethra without improvement of symptoms. No fevers, back pain or chills.   Reviewed with Dr. Sabra Heck, orders received for Macrobid 100 mg BID x 5 days, follow up if not improved.   Call to patient. She is agreeable with starting treatment. Instructions given. Advised to finish treatment and return call if symptoms continue for office visit. Patient agreeable.   Routing to provider for final review. Patient agreeable to disposition. Will close encounter.

## 2014-11-07 ENCOUNTER — Ambulatory Visit
Admission: RE | Admit: 2014-11-07 | Discharge: 2014-11-07 | Disposition: A | Discharge status: Home / Self Care | Payer: BLUE CROSS/BLUE SHIELD | Source: Ambulatory Visit

## 2014-11-07 DIAGNOSIS — Z1231 Encounter for screening mammogram for malignant neoplasm of breast: Secondary | ICD-10-CM

## 2014-12-06 ENCOUNTER — Other Ambulatory Visit: Payer: Self-pay | Admitting: Physician Assistant

## 2015-06-26 ENCOUNTER — Other Ambulatory Visit: Payer: Self-pay | Admitting: Nurse Practitioner

## 2015-06-26 NOTE — Telephone Encounter (Signed)
Medication refill request: MIMVEY LO 0.5-0.1 MG Last AEX:  09/30/14 PG Next AEX: none scheduled Last MMG (if hormonal medication request): 11/07/14 BIRADS1 negative Refill authorized: 09/30/14 #90 w/4 refills; today please advise

## 2015-06-26 NOTE — Telephone Encounter (Signed)
Medication refill request: OCP Last AEX:  09-30-14 Next AEX: not scheduled yet Last MMG (if hormonal medication request): 11-07-14 WNL  Refill authorized: please advise

## 2015-08-24 DIAGNOSIS — Z85828 Personal history of other malignant neoplasm of skin: Secondary | ICD-10-CM

## 2015-08-24 DIAGNOSIS — L089 Local infection of the skin and subcutaneous tissue, unspecified: Secondary | ICD-10-CM | POA: Diagnosis not present

## 2015-08-24 DIAGNOSIS — G4700 Insomnia, unspecified: Secondary | ICD-10-CM | POA: Diagnosis present

## 2015-08-24 DIAGNOSIS — N952 Postmenopausal atrophic vaginitis: Secondary | ICD-10-CM | POA: Diagnosis present

## 2015-08-24 DIAGNOSIS — K567 Ileus, unspecified: Secondary | ICD-10-CM | POA: Diagnosis not present

## 2015-08-24 DIAGNOSIS — M797 Fibromyalgia: Secondary | ICD-10-CM | POA: Diagnosis present

## 2015-08-24 DIAGNOSIS — Z79890 Hormone replacement therapy (postmenopausal): Secondary | ICD-10-CM

## 2015-08-24 DIAGNOSIS — R1011 Right upper quadrant pain: Secondary | ICD-10-CM | POA: Diagnosis not present

## 2015-08-24 DIAGNOSIS — Z86010 Personal history of colonic polyps: Secondary | ICD-10-CM

## 2015-08-24 DIAGNOSIS — K562 Volvulus: Secondary | ICD-10-CM | POA: Diagnosis not present

## 2015-08-24 DIAGNOSIS — Z79899 Other long term (current) drug therapy: Secondary | ICD-10-CM

## 2015-08-24 NOTE — ED Triage Notes (Signed)
Pt c/o right lower abd pain x 12 hrs, nausea only

## 2015-08-25 ENCOUNTER — Emergency Department (HOSPITAL_COMMUNITY): Payer: BLUE CROSS/BLUE SHIELD | Admitting: Anesthesiology

## 2015-08-25 ENCOUNTER — Encounter (HOSPITAL_COMMUNITY): Admission: EM | Disposition: A | Discharge status: Home Health | Payer: Self-pay | Source: Home / Self Care

## 2015-08-25 ENCOUNTER — Inpatient Hospital Stay (HOSPITAL_BASED_OUTPATIENT_CLINIC_OR_DEPARTMENT_OTHER)
Admission: EM | Admit: 2015-08-25 | Discharge: 2015-09-02 | DRG: 331 | Disposition: A | Discharge status: Home Health | Payer: BLUE CROSS/BLUE SHIELD | Attending: Surgery | Admitting: Surgery

## 2015-08-25 ENCOUNTER — Emergency Department (HOSPITAL_BASED_OUTPATIENT_CLINIC_OR_DEPARTMENT_OTHER): Payer: BLUE CROSS/BLUE SHIELD

## 2015-08-25 ENCOUNTER — Encounter (HOSPITAL_BASED_OUTPATIENT_CLINIC_OR_DEPARTMENT_OTHER): Payer: Self-pay | Admitting: *Deleted

## 2015-08-25 DIAGNOSIS — K562 Volvulus: Secondary | ICD-10-CM | POA: Diagnosis present

## 2015-08-25 DIAGNOSIS — M797 Fibromyalgia: Secondary | ICD-10-CM | POA: Diagnosis present

## 2015-08-25 DIAGNOSIS — R1011 Right upper quadrant pain: Secondary | ICD-10-CM | POA: Diagnosis present

## 2015-08-25 DIAGNOSIS — K567 Ileus, unspecified: Secondary | ICD-10-CM | POA: Diagnosis not present

## 2015-08-25 DIAGNOSIS — G4700 Insomnia, unspecified: Secondary | ICD-10-CM

## 2015-08-25 DIAGNOSIS — L089 Local infection of the skin and subcutaneous tissue, unspecified: Secondary | ICD-10-CM | POA: Diagnosis not present

## 2015-08-25 DIAGNOSIS — Z79899 Other long term (current) drug therapy: Secondary | ICD-10-CM | POA: Diagnosis not present

## 2015-08-25 DIAGNOSIS — Z79890 Hormone replacement therapy (postmenopausal): Secondary | ICD-10-CM | POA: Diagnosis not present

## 2015-08-25 DIAGNOSIS — Z86010 Personal history of colonic polyps: Secondary | ICD-10-CM

## 2015-08-25 DIAGNOSIS — Z860101 Personal history of adenomatous and serrated colon polyps: Secondary | ICD-10-CM

## 2015-08-25 DIAGNOSIS — Z85828 Personal history of other malignant neoplasm of skin: Secondary | ICD-10-CM | POA: Diagnosis not present

## 2015-08-25 DIAGNOSIS — N952 Postmenopausal atrophic vaginitis: Secondary | ICD-10-CM | POA: Diagnosis present

## 2015-08-25 HISTORY — DX: Volvulus: K56.2

## 2015-08-25 HISTORY — PX: LAPAROSCOPIC RIGHT COLECTOMY: SHX5925

## 2015-08-25 HISTORY — PX: PARTIAL COLECTOMY: SHX5273

## 2015-08-25 HISTORY — PX: APPENDECTOMY: SHX54

## 2015-08-25 LAB — CBC WITH DIFFERENTIAL/PLATELET
Basophils Absolute: 0 10*3/uL (ref 0.0–0.1)
Basophils Relative: 0 %
Eosinophils Absolute: 0.4 10*3/uL (ref 0.0–0.7)
Eosinophils Relative: 5 %
HEMATOCRIT: 40.1 % (ref 36.0–46.0)
Hemoglobin: 13.6 g/dL (ref 12.0–15.0)
LYMPHS ABS: 1.8 10*3/uL (ref 0.7–4.0)
LYMPHS PCT: 22 %
MCH: 33.2 pg (ref 26.0–34.0)
MCHC: 33.9 g/dL (ref 30.0–36.0)
MCV: 97.8 fL (ref 78.0–100.0)
MONO ABS: 0.7 10*3/uL (ref 0.1–1.0)
MONOS PCT: 9 %
NEUTROS ABS: 5.3 10*3/uL (ref 1.7–7.7)
Neutrophils Relative %: 64 %
Platelets: 225 10*3/uL (ref 150–400)
RBC: 4.1 MIL/uL (ref 3.87–5.11)
RDW: 12.5 % (ref 11.5–15.5)
WBC: 8.3 10*3/uL (ref 4.0–10.5)

## 2015-08-25 LAB — COMPREHENSIVE METABOLIC PANEL
ALT: 21 U/L (ref 14–54)
ANION GAP: 9 (ref 5–15)
AST: 20 U/L (ref 15–41)
Albumin: 3.5 g/dL (ref 3.5–5.0)
Alkaline Phosphatase: 45 U/L (ref 38–126)
BILIRUBIN TOTAL: 0.6 mg/dL (ref 0.3–1.2)
BUN: 17 mg/dL (ref 6–20)
CO2: 31 mmol/L (ref 22–32)
Calcium: 9.7 mg/dL (ref 8.9–10.3)
Chloride: 99 mmol/L — ABNORMAL LOW (ref 101–111)
Creatinine, Ser: 0.83 mg/dL (ref 0.44–1.00)
GFR calc Af Amer: >60 mL/min (ref >60)
Glucose, Bld: 98 mg/dL (ref 65–99)
POTASSIUM: 3.9 mmol/L (ref 3.5–5.1)
Sodium: 139 mmol/L (ref 135–145)
TOTAL PROTEIN: 6.2 g/dL — AB (ref 6.5–8.1)

## 2015-08-25 LAB — URINALYSIS, ROUTINE W REFLEX MICROSCOPIC
BILIRUBIN URINE: NEGATIVE
Glucose, UA: NEGATIVE mg/dL
HGB URINE DIPSTICK: NEGATIVE
Ketones, ur: NEGATIVE mg/dL
Nitrite: NEGATIVE
PROTEIN: NEGATIVE mg/dL
Specific Gravity, Urine: 1.01 (ref 1.005–1.030)
pH: 7 (ref 5.0–8.0)

## 2015-08-25 LAB — LIPASE, BLOOD: LIPASE: 28 U/L (ref 11–51)

## 2015-08-25 LAB — URINE MICROSCOPIC-ADD ON: RBC / HPF: NONE SEEN RBC/hpf (ref 0–5)

## 2015-08-25 SURGERY — COLECTOMY, RIGHT, LAPAROSCOPIC
Anesthesia: General

## 2015-08-25 MED ORDER — PROPOFOL 10 MG/ML IV BOLUS
INTRAVENOUS | Status: AC
Start: 2015-08-25 — End: 2015-08-25
  Filled 2015-08-25: qty 20

## 2015-08-25 MED ORDER — ARTIFICIAL TEARS OP OINT
TOPICAL_OINTMENT | OPHTHALMIC | Status: AC
  Filled 2015-08-25: qty 3.5

## 2015-08-25 MED ORDER — ONDANSETRON HCL 4 MG PO TABS
4.0000 mg | ORAL_TABLET | Freq: Four times a day (QID) | ORAL | Status: DC | PRN
  Administered 2015-08-30: 4 mg via ORAL
  Filled 2015-08-25: qty 1

## 2015-08-25 MED ORDER — SODIUM CHLORIDE 0.9 % IV SOLN
INTRAVENOUS | Status: DC
  Filled 2015-08-25: qty 6

## 2015-08-25 MED ORDER — BUPIVACAINE HCL (PF) 0.5 % IJ SOLN
INTRAMUSCULAR | Status: AC
  Filled 2015-08-25: qty 30

## 2015-08-25 MED ORDER — KCL IN DEXTROSE-NACL 20-5-0.9 MEQ/L-%-% IV SOLN
INTRAVENOUS | Status: DC
  Administered 2015-08-25 – 2015-08-28 (×8): via INTRAVENOUS
  Filled 2015-08-25 (×14): qty 1000

## 2015-08-25 MED ORDER — FENTANYL CITRATE (PF) 100 MCG/2ML IJ SOLN
INTRAMUSCULAR | Status: AC
  Filled 2015-08-25: qty 2

## 2015-08-25 MED ORDER — FENTANYL CITRATE (PF) 250 MCG/5ML IJ SOLN
INTRAMUSCULAR | Status: AC
  Filled 2015-08-25: qty 5

## 2015-08-25 MED ORDER — CEFOTETAN DISODIUM-DEXTROSE 2-2.08 GM-% IV SOLR
INTRAVENOUS | Status: AC
  Filled 2015-08-25: qty 50

## 2015-08-25 MED ORDER — SODIUM CHLORIDE 0.9 % IV SOLN: INTRAVENOUS | Status: DC

## 2015-08-25 MED ORDER — SUGAMMADEX SODIUM 200 MG/2ML IV SOLN
INTRAVENOUS | Status: AC
  Filled 2015-08-25: qty 2

## 2015-08-25 MED ORDER — HEPARIN SODIUM (PORCINE) 5000 UNIT/ML IJ SOLN
5000.0000 [IU] | Freq: Three times a day (TID) | INTRAMUSCULAR | Status: DC
  Administered 2015-08-26 – 2015-09-02 (×21): 5000 [IU] via SUBCUTANEOUS
  Filled 2015-08-25 (×19): qty 1

## 2015-08-25 MED ORDER — FENTANYL CITRATE (PF) 100 MCG/2ML IJ SOLN
25.0000 ug | INTRAMUSCULAR | Status: DC | PRN
  Administered 2015-08-25 (×2): 50 ug via INTRAVENOUS

## 2015-08-25 MED ORDER — SUGAMMADEX SODIUM 200 MG/2ML IV SOLN
INTRAVENOUS | Status: DC | PRN
  Administered 2015-08-25: 200 mg via INTRAVENOUS

## 2015-08-25 MED ORDER — DEXAMETHASONE SODIUM PHOSPHATE 10 MG/ML IJ SOLN
INTRAMUSCULAR | Status: DC | PRN
Start: 2015-08-25 — End: 2015-08-25
  Administered 2015-08-25: 10 mg via INTRAVENOUS

## 2015-08-25 MED ORDER — PROPOFOL 10 MG/ML IV BOLUS
INTRAVENOUS | Status: DC | PRN
  Administered 2015-08-25: 150 mg via INTRAVENOUS

## 2015-08-25 MED ORDER — CEFOTETAN DISODIUM 2 G IJ SOLR
2.0000 g | Freq: Two times a day (BID) | INTRAMUSCULAR | Status: AC
  Administered 2015-08-25: 2 g via INTRAVENOUS
  Filled 2015-08-25: qty 2

## 2015-08-25 MED ORDER — SUCCINYLCHOLINE CHLORIDE 20 MG/ML IJ SOLN
INTRAMUSCULAR | Status: DC | PRN
  Administered 2015-08-25: 100 mg via INTRAVENOUS

## 2015-08-25 MED ORDER — MORPHINE SULFATE (PF) 4 MG/ML IV SOLN
4.0000 mg | INTRAVENOUS | Status: AC | PRN
  Administered 2015-08-25: 4 mg via INTRAVENOUS
  Filled 2015-08-25: qty 1

## 2015-08-25 MED ORDER — ONDANSETRON HCL 4 MG/2ML IJ SOLN
4.0000 mg | Freq: Once | INTRAMUSCULAR | Status: AC
  Administered 2015-08-25: 4 mg via INTRAVENOUS
  Filled 2015-08-25: qty 2

## 2015-08-25 MED ORDER — SODIUM CHLORIDE 0.9 % IV SOLN
INTRAVENOUS | Status: DC | PRN
  Administered 2015-08-25: 11:00:00 via INTRAPERITONEAL

## 2015-08-25 MED ORDER — LACTATED RINGERS IV BOLUS (SEPSIS): 1000.0000 mL | Freq: Three times a day (TID) | INTRAVENOUS | Status: DC | PRN

## 2015-08-25 MED ORDER — ACETAMINOPHEN 325 MG PO TABS: 325.0000 mg | ORAL_TABLET | Freq: Four times a day (QID) | ORAL | Status: DC | PRN

## 2015-08-25 MED ORDER — ALVIMOPAN 12 MG PO CAPS
12.0000 mg | ORAL_CAPSULE | Freq: Two times a day (BID) | ORAL | Status: DC
  Administered 2015-08-26 – 2015-08-30 (×10): 12 mg via ORAL
  Filled 2015-08-25 (×12): qty 1

## 2015-08-25 MED ORDER — MENTHOL 3 MG MT LOZG: 1.0000 | LOZENGE | OROMUCOSAL | Status: DC | PRN

## 2015-08-25 MED ORDER — ONDANSETRON HCL 4 MG/2ML IJ SOLN
4.0000 mg | INTRAMUSCULAR | Status: DC | PRN
  Administered 2015-08-25 – 2015-08-28 (×8): 4 mg via INTRAVENOUS
  Filled 2015-08-25 (×7): qty 2

## 2015-08-25 MED ORDER — ONDANSETRON HCL 4 MG/2ML IJ SOLN
INTRAMUSCULAR | Status: AC
  Filled 2015-08-25: qty 2

## 2015-08-25 MED ORDER — FENTANYL CITRATE (PF) 100 MCG/2ML IJ SOLN
INTRAMUSCULAR | Status: AC
Start: 2015-08-25 — End: 2015-08-26
  Filled 2015-08-25: qty 2

## 2015-08-25 MED ORDER — MORPHINE SULFATE (PF) 4 MG/ML IV SOLN
INTRAVENOUS | Status: AC
  Administered 2015-08-25: 4 mg via INTRAVENOUS
  Filled 2015-08-25: qty 1

## 2015-08-25 MED ORDER — MIDAZOLAM HCL 2 MG/2ML IJ SOLN
INTRAMUSCULAR | Status: AC
  Filled 2015-08-25: qty 2

## 2015-08-25 MED ORDER — PHENOL 1.4 % MT LIQD: 2.0000 | OROMUCOSAL | Status: DC | PRN

## 2015-08-25 MED ORDER — ROCURONIUM BROMIDE 100 MG/10ML IV SOLN
INTRAVENOUS | Status: DC | PRN
  Administered 2015-08-25: 30 mg via INTRAVENOUS
  Administered 2015-08-25 (×2): 10 mg via INTRAVENOUS

## 2015-08-25 MED ORDER — FENTANYL CITRATE (PF) 250 MCG/5ML IJ SOLN
INTRAMUSCULAR | Status: DC | PRN
  Administered 2015-08-25 (×3): 50 ug via INTRAVENOUS
  Administered 2015-08-25: 100 ug via INTRAVENOUS
  Administered 2015-08-25 (×2): 50 ug via INTRAVENOUS

## 2015-08-25 MED ORDER — DEXTROSE 5 % IV SOLN
2.0000 g | INTRAVENOUS | Status: AC
  Administered 2015-08-25: 2 g via INTRAVENOUS

## 2015-08-25 MED ORDER — ONDANSETRON HCL 4 MG/2ML IJ SOLN: 4.0000 mg | Freq: Four times a day (QID) | INTRAMUSCULAR | Status: DC | PRN

## 2015-08-25 MED ORDER — GABAPENTIN 300 MG PO CAPS
300.0000 mg | ORAL_CAPSULE | ORAL | Status: AC
  Administered 2015-08-25: 300 mg via ORAL
  Filled 2015-08-25: qty 1

## 2015-08-25 MED ORDER — ONDANSETRON HCL 4 MG/2ML IJ SOLN
INTRAMUSCULAR | Status: DC | PRN
  Administered 2015-08-25: 4 mg via INTRAVENOUS

## 2015-08-25 MED ORDER — PROCHLORPERAZINE EDISYLATE 5 MG/ML IJ SOLN: 5.0000 mg | INTRAMUSCULAR | Status: DC | PRN

## 2015-08-25 MED ORDER — ALVIMOPAN 12 MG PO CAPS
12.0000 mg | ORAL_CAPSULE | Freq: Once | ORAL | Status: AC
  Administered 2015-08-25: 12 mg via ORAL
  Filled 2015-08-25: qty 1

## 2015-08-25 MED ORDER — ACETAMINOPHEN 500 MG PO TABS
ORAL_TABLET | ORAL | Status: AC
  Filled 2015-08-25: qty 2

## 2015-08-25 MED ORDER — ACETAMINOPHEN 325 MG PO TABS
ORAL_TABLET | ORAL | Status: AC
  Filled 2015-08-25: qty 3

## 2015-08-25 MED ORDER — NALOXONE HCL 0.4 MG/ML IJ SOLN: 0.4000 mg | INTRAMUSCULAR | Status: DC | PRN

## 2015-08-25 MED ORDER — LIDOCAINE HCL (CARDIAC) 20 MG/ML IV SOLN
INTRAVENOUS | Status: DC | PRN
  Administered 2015-08-25: 60 mg via INTRATRACHEAL

## 2015-08-25 MED ORDER — LACTATED RINGERS IV SOLN
INTRAVENOUS | Status: DC
  Administered 2015-08-25 (×3): via INTRAVENOUS

## 2015-08-25 MED ORDER — SODIUM CHLORIDE 0.9% FLUSH: 9.0000 mL | INTRAVENOUS | Status: DC | PRN

## 2015-08-25 MED ORDER — LORAZEPAM 1 MG PO TABS
1.0000 mg | ORAL_TABLET | Freq: Every evening | ORAL | Status: DC | PRN
  Administered 2015-08-29 – 2015-09-01 (×3): 1 mg via ORAL
  Filled 2015-08-25 (×3): qty 1

## 2015-08-25 MED ORDER — MAGIC MOUTHWASH: 15.0000 mL | Freq: Four times a day (QID) | ORAL | Status: DC | PRN

## 2015-08-25 MED ORDER — IOPAMIDOL (ISOVUE-300) INJECTION 61%
100.0000 mL | Freq: Once | INTRAVENOUS | Status: AC | PRN
  Administered 2015-08-25: 100 mL via INTRAVENOUS

## 2015-08-25 MED ORDER — METOPROLOL TARTRATE 25 MG PO TABS: 12.5000 mg | ORAL_TABLET | Freq: Two times a day (BID) | ORAL | Status: DC | PRN

## 2015-08-25 MED ORDER — BUPIVACAINE HCL (PF) 0.5 % IJ SOLN
INTRAMUSCULAR | Status: DC | PRN
  Administered 2015-08-25: 5 mL

## 2015-08-25 MED ORDER — LIP MEDEX EX OINT: 1.0000 "application " | TOPICAL_OINTMENT | Freq: Two times a day (BID) | CUTANEOUS | Status: DC

## 2015-08-25 MED ORDER — HYDROMORPHONE HCL 1 MG/ML IJ SOLN: 0.5000 mg | INTRAMUSCULAR | Status: DC | PRN

## 2015-08-25 MED ORDER — MORPHINE SULFATE 2 MG/ML IV SOLN
INTRAVENOUS | Status: DC
  Administered 2015-08-25: 15.4 mg via INTRAVENOUS
  Administered 2015-08-25: 4.5 mg via INTRAVENOUS
  Administered 2015-08-25: 10.5 mg via INTRAVENOUS
  Administered 2015-08-25: 12:00:00 via INTRAVENOUS
  Administered 2015-08-26: 4.25 mg via INTRAVENOUS
  Administered 2015-08-26: 9 mg via INTRAVENOUS
  Administered 2015-08-26: 12 mg via INTRAVENOUS
  Administered 2015-08-26: 22:00:00 via INTRAVENOUS
  Administered 2015-08-26: 1.5 mg via INTRAVENOUS
  Administered 2015-08-26: 10.5 mg via INTRAVENOUS
  Administered 2015-08-26: 06:00:00 via INTRAVENOUS
  Administered 2015-08-26: 1.09 mg via INTRAVENOUS
  Administered 2015-08-27: 13:00:00 via INTRAVENOUS
  Administered 2015-08-27: 9 mg via INTRAVENOUS
  Administered 2015-08-27: 15 mg via INTRAVENOUS
  Administered 2015-08-28: 14.8 mg via INTRAVENOUS
  Administered 2015-08-28: 10.1 mg via INTRAVENOUS
  Administered 2015-08-28: 24 mg via INTRAVENOUS
  Filled 2015-08-25 (×4): qty 25

## 2015-08-25 MED ORDER — MORPHINE SULFATE (PF) 4 MG/ML IV SOLN
4.0000 mg | Freq: Once | INTRAVENOUS | Status: AC
  Administered 2015-08-25: 4 mg via INTRAVENOUS

## 2015-08-25 MED ORDER — LIDOCAINE HCL (CARDIAC) 20 MG/ML IV SOLN
INTRAVENOUS | Status: AC
  Filled 2015-08-25: qty 5

## 2015-08-25 MED ORDER — ONDANSETRON HCL 4 MG/2ML IJ SOLN
4.0000 mg | Freq: Four times a day (QID) | INTRAMUSCULAR | Status: DC | PRN
Start: 2015-08-25 — End: 2015-08-25

## 2015-08-25 MED ORDER — METOPROLOL TARTRATE 5 MG/5ML IV SOLN: 5.0000 mg | Freq: Four times a day (QID) | INTRAVENOUS | Status: DC | PRN

## 2015-08-25 MED ORDER — ALUM & MAG HYDROXIDE-SIMETH 200-200-20 MG/5ML PO SUSP: 30.0000 mL | Freq: Four times a day (QID) | ORAL | Status: DC | PRN

## 2015-08-25 MED ORDER — MIDAZOLAM HCL 5 MG/5ML IJ SOLN
INTRAMUSCULAR | Status: DC | PRN
  Administered 2015-08-25: 2 mg via INTRAVENOUS

## 2015-08-25 MED ORDER — SODIUM CHLORIDE 0.9 % IV SOLN: 8.0000 mg | Freq: Four times a day (QID) | INTRAVENOUS | Status: DC | PRN

## 2015-08-25 MED ORDER — ACETAMINOPHEN 500 MG PO TABS
1000.0000 mg | ORAL_TABLET | ORAL | Status: AC
  Administered 2015-08-25: 1000 mg via ORAL

## 2015-08-25 MED ORDER — DIPHENHYDRAMINE HCL 50 MG/ML IJ SOLN: 12.5000 mg | Freq: Four times a day (QID) | INTRAMUSCULAR | Status: DC | PRN

## 2015-08-25 MED ORDER — FENTANYL CITRATE (PF) 100 MCG/2ML IJ SOLN
50.0000 ug | Freq: Once | INTRAMUSCULAR | Status: AC
  Administered 2015-08-25: 50 ug via INTRAVENOUS
  Filled 2015-08-25: qty 2

## 2015-08-25 MED ORDER — DIPHENHYDRAMINE HCL 12.5 MG/5ML PO ELIX
12.5000 mg | ORAL_SOLUTION | Freq: Four times a day (QID) | ORAL | Status: DC | PRN
  Filled 2015-08-25: qty 5

## 2015-08-25 MED ORDER — 0.9 % SODIUM CHLORIDE (POUR BTL) OPTIME
TOPICAL | Status: DC | PRN
  Administered 2015-08-25: 2000 mL

## 2015-08-25 MED ORDER — ROCURONIUM BROMIDE 100 MG/10ML IV SOLN
INTRAVENOUS | Status: AC
  Filled 2015-08-25: qty 1

## 2015-08-25 MED ORDER — DEXAMETHASONE SODIUM PHOSPHATE 10 MG/ML IJ SOLN
INTRAMUSCULAR | Status: AC
  Filled 2015-08-25: qty 1

## 2015-08-25 SURGICAL SUPPLY — 73 items
APPLIER CLIP 5 13 M/L LIGAMAX5 (MISCELLANEOUS)
APPLIER CLIP ROT 10 11.4 M/L (STAPLE)
APR CLP MED LRG 11.4X10 (STAPLE)
APR CLP MED LRG 5 ANG JAW (MISCELLANEOUS)
BLADE EXTENDED COATED 6.5IN (ELECTRODE) ×1 IMPLANT
BLADE HEX COATED 2.75 (ELECTRODE) ×6 IMPLANT
CABLE HIGH FREQUENCY MONO STRZ (ELECTRODE) ×3 IMPLANT
CELLS DAT CNTRL 66122 CELL SVR (MISCELLANEOUS) ×2 IMPLANT
CHLORAPREP W/TINT 26ML (MISCELLANEOUS) ×1 IMPLANT
CLIP APPLIE 5 13 M/L LIGAMAX5 (MISCELLANEOUS) IMPLANT
CLIP APPLIE ROT 10 11.4 M/L (STAPLE) IMPLANT
COVER SURGICAL LIGHT HANDLE (MISCELLANEOUS) ×3 IMPLANT
DECANTER SPIKE VIAL GLASS SM (MISCELLANEOUS) ×3 IMPLANT
DISSECTOR BLUNT TIP ENDO 5MM (MISCELLANEOUS) IMPLANT
DRAPE LAPAROSCOPIC ABDOMINAL (DRAPES) ×3 IMPLANT
DRAPE UTILITY XL STRL (DRAPES) ×6 IMPLANT
DRSG OPSITE POSTOP 4X10 (GAUZE/BANDAGES/DRESSINGS) ×1 IMPLANT
ELECT REM PT RETURN 9FT ADLT (ELECTROSURGICAL) ×3
ELECTRODE REM PT RTRN 9FT ADLT (ELECTROSURGICAL) ×2 IMPLANT
FILTER SMOKE EVAC LAPAROSHD (FILTER) ×1 IMPLANT
GAUZE SPONGE 4X4 12PLY STRL (GAUZE/BANDAGES/DRESSINGS) ×2 IMPLANT
GLOVE BIOGEL PI IND STRL 7.0 (GLOVE) ×2 IMPLANT
GLOVE BIOGEL PI INDICATOR 7.0 (GLOVE) ×2
GLOVE ECLIPSE 8.0 STRL XLNG CF (GLOVE) ×9 IMPLANT
GLOVE INDICATOR 8.0 STRL GRN (GLOVE) ×8 IMPLANT
GOWN STRL REUS W/TWL LRG LVL3 (GOWN DISPOSABLE) ×2 IMPLANT
GOWN STRL REUS W/TWL XL LVL3 (GOWN DISPOSABLE) ×15 IMPLANT
IRRIG SUCT STRYKERFLOW 2 WTIP (MISCELLANEOUS) ×3
IRRIGATION SUCT STRKRFLW 2 WTP (MISCELLANEOUS) IMPLANT
LEGGING LITHOTOMY PAIR STRL (DRAPES) IMPLANT
LIGASURE IMPACT 36 18CM CVD LR (INSTRUMENTS) ×1 IMPLANT
NS IRRIG 1000ML POUR BTL (IV SOLUTION) ×6 IMPLANT
PACK COLON (CUSTOM PROCEDURE TRAY) ×3 IMPLANT
PAD POSITIONING PINK XL (MISCELLANEOUS) ×1 IMPLANT
POSITIONER SURGICAL ARM (MISCELLANEOUS) IMPLANT
RELOAD AUTO 90-3.5 TA90 BLE (ENDOMECHANICALS) ×3 IMPLANT
RELOAD PROXIMATE 75MM BLUE (ENDOMECHANICALS) ×9 IMPLANT
RELOAD STAPLE 75 3.8 BLU REG (ENDOMECHANICALS) IMPLANT
RELOAD STAPLE 90 BLU REG (ENDOMECHANICALS) IMPLANT
RETRACTOR WND ALEXIS 18 MED (MISCELLANEOUS) IMPLANT
RTRCTR WOUND ALEXIS 18CM MED (MISCELLANEOUS) ×3
SCISSORS LAP 5X35 DISP (ENDOMECHANICALS) ×3 IMPLANT
SHEARS HARMONIC ACE PLUS 36CM (ENDOMECHANICALS) IMPLANT
SLEEVE XCEL OPT CAN 5 100 (ENDOMECHANICALS) IMPLANT
SOLUTION ANTI FOG 6CC (MISCELLANEOUS) ×3 IMPLANT
STAPLER 90 3.5 STAND SLIM (STAPLE) ×3
STAPLER 90 3.5 STD SLIM (STAPLE) IMPLANT
STAPLER PROXIMATE 75MM BLUE (STAPLE) ×1 IMPLANT
STAPLER VISISTAT 35W (STAPLE) ×3 IMPLANT
SUT PDS AB 1 CTX 36 (SUTURE) ×2 IMPLANT
SUT PDS AB 1 TP1 96 (SUTURE) IMPLANT
SUT PROLENE 2 0 SH DA (SUTURE) IMPLANT
SUT SILK 2 0 (SUTURE) ×3
SUT SILK 2 0 SH CR/8 (SUTURE) ×5 IMPLANT
SUT SILK 2-0 18XBRD TIE 12 (SUTURE) ×2 IMPLANT
SUT SILK 3 0 (SUTURE) ×3
SUT SILK 3 0 SH CR/8 (SUTURE) ×3 IMPLANT
SUT SILK 3-0 18XBRD TIE 12 (SUTURE) ×2 IMPLANT
SUT VICRYL 2 0 18  UND BR (SUTURE) ×1
SUT VICRYL 2 0 18 UND BR (SUTURE) ×2 IMPLANT
SYS LAPSCP GELPORT 120MM (MISCELLANEOUS)
SYSTEM LAPSCP GELPORT 120MM (MISCELLANEOUS) IMPLANT
TAPE CLOTH 4X10 WHT NS (GAUZE/BANDAGES/DRESSINGS) IMPLANT
TOWEL OR NON WOVEN STRL DISP B (DISPOSABLE) ×6 IMPLANT
TRAY FOLEY W/METER SILVER 14FR (SET/KITS/TRAYS/PACK) ×3 IMPLANT
TRAY FOLEY W/METER SILVER 16FR (SET/KITS/TRAYS/PACK) ×2 IMPLANT
TROCAR BLADELESS OPT 5 100 (ENDOMECHANICALS) IMPLANT
TROCAR BLADELESS OPT 5 75 (ENDOMECHANICALS) ×6 IMPLANT
TROCAR XCEL BLUNT TIP 100MML (ENDOMECHANICALS) IMPLANT
TROCAR XCEL NON-BLD 11X100MML (ENDOMECHANICALS) IMPLANT
TROCAR XCEL UNIV SLVE 11M 100M (ENDOMECHANICALS) IMPLANT
TUBING INSUF HEATED (TUBING) ×3 IMPLANT
YANKAUER SUCT BULB TIP NO VENT (SUCTIONS) IMPLANT

## 2015-08-25 NOTE — ED Notes (Signed)
Consent form has been signed by patient and CHG bath completed.

## 2015-08-25 NOTE — Transfer of Care (Signed)
Immediate Anesthesia Transfer of Care Note  Patient: Madison Carr  Procedure(s) Performed: Procedure(s): Diagnostic LAPAROSCOPY (N/A) Extended right COLECTOMY  Patient Location: PACU  Anesthesia Type:General  Level of Consciousness: awake, alert , oriented and patient cooperative  Airway & Oxygen Therapy: Patient Spontanous Breathing and Patient connected to face mask oxygen  Post-op Assessment: Report given to RN and Post -op Vital signs reviewed and stable  Post vital signs: Reviewed and stable  Last Vitals:  Vitals:   08/25/15 0546 08/25/15 0739  BP: 144/77 146/82  Pulse: 68 68  Resp: 16 16  Temp: 36.9 C 36.9 C    Last Pain:  Vitals:   08/25/15 0739  TempSrc: Oral  PainSc:       Patients Stated Pain Goal: 1 ([phone])  Complications: No apparent anesthesia complications

## 2015-08-25 NOTE — Op Note (Signed)
Operative Note  Madison Carr female 65 y.o. 08/25/2015  PREOPERATIVE DX:  [name]  POSTOPERATIVE DX:  Same  PROCEDURE:   Emergency diagnostic laparoscopy converted to exploratory laparotomy with extended right colectomy         Surgeon: Odis Hollingshead   Assistants: Michael Boston M.D.  Anesthesia: General endotracheal anesthesia  Indications:   65 year old female who had acute onset of abdominal pain and distention. She presented to emergency department with a tender abdomen and a CT scan demonstrating cecal volvulus with twisting of the mesentery. She now brought to the operating room for emergency surgery. The procedure and risks have been discussed with her preoperatively.    Procedure Detail:  She was brought to the operative placement on the operative table and general anesthetic given. Foley catheter was inserted. Oral gastric tube was inserted. The abdomen was widely sterilely prepped and draped. A timeout was performed.  A small subumbilical incision was made through the skin and subcutaneous tissue fascia and peritoneum under direct vision. Pursestring suture was placed around the edges of the fascia. A Hassan trocar was introduced into the peritoneal cavity and pneumoperitoneum created. Laparoscope was introduced. Dilated right colon was noted over in the left abdomen. A 5 mm trochars placed in the lower midline. I tried to reduce the volvulus laparoscopically but was unable. Subsequently I removed the trocars and made a periumbilical midline incision through all layers. At this point I was able to externalize the cecum and right colon. She had a significant amount of right colon and transverse colon that was redundant. There was inflammatory change and serosal tear present in the proximal right colon. Right colectomy was planned.  The ileum was divided proximal to the ileocecal valve with the linear cutting stapler. The transverse colon was divided just proximal to the  middle colic vessels with the linear cutting stapler. The mesenteric vessels were divided close to the colon with the LigaSure device. Larger vessels were ligated with 2-0 silk ties. Noted be a large amount of stool the transverse colon which was milked distally. The specimen was handed off the field. A side-to-side staple anastomosis was then created with a linear cutting stapler. Common defect was closed with a linear noncutting stapler. The mesenteric defect was closed with interrupted 3-0 silk sutures. The appeared to be somewhat of a small bowel diverticulum and so I initially amputated this but this slightly compromised the lumen of the anastomosis which is down to 2 cm. I decided to redo the anastomosis. I resected the anastomosis and then redid it in a side-to-side fashion with the linear cutting stapler. Common defect was then closed with the linear noncutting stapler. The anastomosis was widely patent, viable, and under no tension. A crotch stitch of 3-0 silk was placed. Mesenteric defect was closed with interrupted 3-0 silk sutures.  The bowel was then dropped back into the abdominal cavity. Abdominal cavity was then irrigated with saline solution and antibiotic solution. Gloves gowns and instruments were changed. Inspection, cavity demonstrated no evidence of organ injury or bleeding. Midline fascia was closed with running #1 PDS suture. Needle sponge and instrument counts were correct. Subcutaneous tissue was irrigated and skin was closed with staples.  She tolerated the procedure without apparent complications. She was taken to recovery in satisfactory condition. Estimated blood loss approximately 200 cc. Specimen is distal ileum, right colon, part of transverse colon.          Complications:  * No complications entered in OR log *  Disposition: PACU - hemodynamically stable.         Condition: stable

## 2015-08-25 NOTE — Anesthesia Preprocedure Evaluation (Addendum)
Anesthesia Evaluation  Patient identified by MRN, date of birth, ID band Patient awake    Reviewed: Allergy & Precautions, NPO status , Patient's Chart, lab work & pertinent test results  History of Anesthesia Complications Negative for: history of anesthetic complications  Airway Mallampati: I  TM Distance: >3 FB Neck ROM: Full    Dental   Pulmonary neg pulmonary ROS,    Pulmonary exam normal        Cardiovascular negative cardio ROS Normal cardiovascular exam     Neuro/Psych    GI/Hepatic Neg liver ROS, Suspected volvulus   Endo/Other  negative endocrine ROS  Renal/GU negative Renal ROS     Musculoskeletal  (+) Fibromyalgia -Basal cell carcinoma   Abdominal   Peds  Hematology negative hematology ROS (+)   Anesthesia Other Findings   Reproductive/Obstetrics                             Anesthesia Physical Anesthesia Plan  ASA: I  Anesthesia Plan: General   Post-op Pain Management:    Induction: Intravenous  Airway Management Planned: Oral ETT  Additional Equipment: None  Intra-op Plan:   Post-operative Plan: Extubation in OR  Informed Consent: I have reviewed the patients History and Physical, chart, labs and discussed the procedure including the risks, benefits and alternatives for the proposed anesthesia with the patient or authorized representative who has indicated his/her understanding and acceptance.   Dental advisory given  Plan Discussed with:   Anesthesia Plan Comments:         Anesthesia Quick Evaluation

## 2015-08-25 NOTE — ED Notes (Signed)
Patient has given all her jewelry to husband.

## 2015-08-25 NOTE — Anesthesia Postprocedure Evaluation (Signed)
Anesthesia Post Note  Patient: Madison Carr  Procedure(s) Performed: Procedure(s) (LRB): Diagnostic LAPAROSCOPY (N/A) Extended right COLECTOMY  Patient location during evaluation: PACU Anesthesia Type: General Level of consciousness: awake and alert Pain management: pain level controlled Vital Signs Assessment: post-procedure vital signs reviewed and stable Respiratory status: spontaneous breathing, nonlabored ventilation, respiratory function stable and patient connected to nasal cannula oxygen Cardiovascular status: blood pressure returned to baseline and stable Postop Assessment: no signs of nausea or vomiting Anesthetic complications: no    Last Vitals:  Vitals:   08/25/15 1130 08/25/15 1145  BP: (!) 161/75 (!) 172/71  Pulse: 73 71  Resp: 15 14  Temp: 36.6 C     Last Pain:  Vitals:   08/25/15 0739  TempSrc: Oral  PainSc:                  Reginal Lutes

## 2015-08-25 NOTE — Anesthesia Procedure Notes (Signed)
Procedure Name: Intubation Date/Time: 08/25/2015 9:17 AM Performed by: Dione Booze Pre-anesthesia Checklist: Emergency Drugs available, Suction available, Patient being monitored and Patient identified Patient Re-evaluated:Patient Re-evaluated prior to inductionOxygen Delivery Method: Circle system utilized Preoxygenation: Pre-oxygenation with 100% oxygen Intubation Type: IV induction Ventilation: Mask ventilation without difficulty Laryngoscope Size: Mac and 4 Grade View: Grade II Tube type: Oral Tube size: 7.5 mm Number of attempts: 1 Airway Equipment and Method: Stylet Placement Confirmation: ETT inserted through vocal cords under direct vision,  positive ETCO2 and breath sounds checked- equal and bilateral Secured at: 21 cm Tube secured with: Tape Dental Injury: Teeth and Oropharynx as per pre-operative assessment

## 2015-08-25 NOTE — ED Provider Notes (Addendum)
[facility]PT [facility] Provider Note   CSN: [id number] Arrival date & time: 08/24/15  2352  First Provider Contact:  None       History   Chief Complaint Chief Complaint  Patient presents with  . Abdominal Pain    HPI Madison Carr is a 65 y.o. female.  Patient is a 65 year old female with history of fibromyalgia and basal cell carcinoma. She presents for evaluation of abdominal pain. This started earlier this afternoon and is located on the right side. She reports this pain comes in waves and describes it as a contraction. She denies any fevers or chills. She denies any nausea or vomiting.   The history is provided by the patient.  Abdominal Pain   This is a new problem. The current episode started 6 to 12 hours ago. The problem occurs constantly. The problem has been gradually worsening. The pain is associated with an unknown factor. The pain is located in the RUQ and RLQ. The pain is moderate. Pertinent negatives include fever, vomiting, constipation, dysuria and hematuria. Nothing aggravates the symptoms. Nothing relieves the symptoms.    Past Medical History:  Diagnosis Date  . Basal cell carcinoma    nose  . Fibromyalgia   . Insomnia   . 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 Postmenopausal HRT (hormone replacement therapy) 10/09/2014    Past Surgical History:  Procedure Laterality Date  . BASAL CELL CARCINOMA EXCISION  2008 ?   nose  . COLPOSCOPY W/ BIOPSY / CURETTAGE  07/11/2008   benign with focal koilocytic atypia, followup remained ASCUS with neg. HR HPV through 2013  . ENDOMETRIAL ABLATION  2005  . TONSILLECTOMY AND ADENOIDECTOMY  1968    OB History    Gravida Para Term Preterm AB Living   3 2 2  0 1 2   SAB TAB Ectopic Multiple Live Births   1 0 0 0 2       Home Medications    Prior to Admission medications   Medication Sig Start Date End Date Taking? Authorizing Provider    amitriptyline (ELAVIL) 10 MG tablet Take 1 tablet (10 mg total) by mouth at bedtime. 09/30/14   Kem Boroughs, FNP  CALCIUM PO Take by mouth daily.    Historical Provider, MD  cholecalciferol (VITAMIN D) 1000 UNITS tablet Take 1,000 Units by mouth daily.    Historical Provider, MD  Cyanocobalamin (VITAMIN B-12 PO) Take 1,000 mg by mouth daily. Vitamin B12    Historical Provider, MD  estradiol (ESTRACE VAGINAL) 0.1 MG/GM vaginal cream APPLY to urethra twice day. 09/30/14   Kem Boroughs, FNP  Estradiol (VAGIFEM) 10 MCG TABS vaginal tablet Place 1 tablet (10 mcg total) vaginally 2 (two) times a week. 09/30/14   Kem Boroughs, FNP  LORazepam (ATIVAN) 1 MG tablet Take 1 mg by mouth as needed.  08/29/12   Historical Provider, MD  MIMVEY LO 0.5-0.1 MG tablet TAKE 1/2 TABLET DAILY 06/26/15   Nunzio Cobbs, MD  Multiple Vitamins-Minerals [facility] ADULT 50+) CAPS Take 1 capsule by mouth daily.    Historical Provider, MD    Family History Family History  Problem Relation Age of Onset  . Melanoma Mother 25    left leg  . Heart disease Father     macular deg.  . Macular degeneration Father   . Macular degeneration Paternal Aunt   . Macular degeneration Paternal Uncle     Social  History Social History  Substance Use Topics  . Smoking status: Never Smoker  . Smokeless tobacco: Never Used  . Alcohol use 2.5 oz/week    5 Standard drinks or equivalent per week     Allergies   Review of patient's allergies indicates no known allergies.   Review of Systems Review of Systems  Constitutional: Negative for fever.  Gastrointestinal: Positive for abdominal pain. Negative for constipation and vomiting.  Genitourinary: Negative for dysuria and hematuria.  All other systems reviewed and are negative.    Physical Exam Updated Vital Signs BP 149/97   Pulse 65   Temp 97.9 F (36.6 C) (Oral)   Resp 16   Ht 5\' 7"  (1.702 m)   Wt 132 lb (59.9 kg)   LMP 11/22/2003 (Approximate)   SpO2 100%    BMI 20.67 kg/m   Physical Exam  Constitutional: She is oriented to person, place, and time. She appears well-developed and well-nourished. No distress.  HENT:  Head: Normocephalic and atraumatic.  Neck: Normal range of motion. Neck supple.  Cardiovascular: Normal rate and regular rhythm.  Exam reveals no gallop and no friction rub.   No murmur heard. Pulmonary/Chest: Effort normal and breath sounds normal. No respiratory distress. She has no wheezes.  Abdominal: Soft. Bowel sounds are normal. She exhibits no distension and no mass. There is tenderness. There is no rebound and no guarding.  There is tenderness to palpation in the right lower quadrant, and to a lesser degree the right upper quadrant. Her abdomen is somewhat tympanitic.  Musculoskeletal: Normal range of motion.  Neurological: She is alert and oriented to person, place, and time.  Skin: Skin is warm and dry. She is not diaphoretic.  Nursing note and vitals reviewed.    ED Treatments / Results  Labs (all labs ordered are listed, but only abnormal results are displayed) Labs Reviewed  URINALYSIS, ROUTINE W REFLEX MICROSCOPIC (NOT AT [facility]) - Abnormal; Notable for the following:       Result Value   APPearance CLOUDY (*)    Leukocytes, UA SMALL (*)    All other components within normal limits  URINE MICROSCOPIC-ADD ON - Abnormal; Notable for the following:    Squamous Epithelial / LPF 0-5 (*)    Bacteria, UA RARE (*)    All other components within normal limits  URINE CULTURE  CBC WITH DIFFERENTIAL/PLATELET  COMPREHENSIVE METABOLIC PANEL  LIPASE, BLOOD    EKG  EKG Interpretation None       Radiology No results found.  Procedures Procedures (including critical care time)  Medications Ordered in ED Medications  fentaNYL (SUBLIMAZE) injection 50 mcg (not administered)  ondansetron (ZOFRAN) injection 4 mg (not administered)     Initial Impression / Assessment and Plan / ED Course  I have reviewed  the triage vital signs and the nursing notes.  Pertinent labs & imaging results that were available during my care of the patient were reviewed by me and considered in my medical decision making (see chart for details).  Clinical Course      Final Clinical Impressions(s) / ED Diagnoses   Final diagnoses:  None   CT scan reveals a cecal volvulus. I discussed this finding with Dr. Johney Maine from general surgery who is recommending the patient be transferred to the ER at [facility] where she can undergo surgical consultation. I have spoken with Dr. Kathrynn Humble in the emergency department who agrees to accept the patient in transfer.  Dr. Johney Maine is requesting information about prior  colonoscopy. The patient does report having several colonoscopies in the past to monitor polyps. She tells me her most recent colonoscopy was 2 or 3 years ago and performed in [place].  New Prescriptions New Prescriptions   No medications on file     Veryl Speak, MD 08/25/15 [place], MD 08/25/15 [phone]

## 2015-08-25 NOTE — Progress Notes (Signed)
Patient seen and examined.  Chart reviewed.  Plan emergency laparoscopic assisted, possible open partial colectomy for acute cecal volvulus.  I have explained the procedure and risks.  Risks include but are not limited to bleeding, infection, wound problems, anesthesia, anastomotic leak, need for colostomy, need for reoperative surgery,  injury to intraabominal organs (such as intestine, spleen, kidney, bladder, ureter, etc.), ileus.  She seems to understand and agrees to proceed.

## 2015-08-25 NOTE — ED Notes (Signed)
Spoke to [place] in [place] and they will come and get patient soon.

## 2015-08-25 NOTE — H&P (Signed)
Madison Carr  [address] [place], [place] [address] Phone: [phone] FAX: [place]  1950/05/09 [id number]  CARE TEAM:  PCP: Pcp Not In System  Outpatient Care Team: Patient Care Team: Pcp Not In System as PCP - General  Inpatient Treatment Team: Treatment Team: Attending Provider: Varney Biles, MD; Registered Nurse: Tamera Reason, RN; Consulting Physician: Nolon Nations, MD; Attending Physician: Varney Biles, MD  This patient is a 65 y.o.female who presents today for surgical evaluation at the request of Dr Madison Carr, [facility] ED.   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  History of colon polyps.  Usually gets polypectomies every five years.  Sharp abdominal pain on the right side yesterday afternoon.  Worsen.  Went to the emergency department at [place].  An concerning for right sided volvulus.  Surgical consultation requested.  Patient feels bloated.  No flatus or bowel movement since this happened.  Normally has a bowel movement every day.  She has not had any abdominal surgery.  She exercises and plays tennis quite regularly.  She does not smoke.  No diabetes.  No cardiopulmonary problems.  Not on blood thinners.  Husband at bedside.  No personal nor family history of GI/colon cancer, inflammatory bowel disease, irritable bowel syndrome, allergy such as Celiac Sprue, dietary/dairy problems, colitis, ulcers nor gastritis.  No recent sick contacts/gastroenteritis.  No travel outside the country.  No changes in diet.  No dysphagia to solids or liquids.  No significant heartburn or reflux.  No hematochezia, hematemesis, coffee ground emesis.  No evidence of prior gastric/peptic ulceration.   Past Medical History:  Diagnosis Date  . Basal cell carcinoma    nose  . Fibromyalgia   . Insomnia   . Postmenopausal atrophic vaginitis 5/09    Past Surgical History:   Procedure Laterality Date  . BASAL CELL CARCINOMA EXCISION  2008 ?   nose  . COLPOSCOPY W/ BIOPSY / CURETTAGE  07/11/2008   benign with focal koilocytic atypia, followup remained ASCUS with neg. HR HPV through 2013  . ENDOMETRIAL ABLATION  2005  . TONSILLECTOMY AND ADENOIDECTOMY  1968    Social History   Social History  . Marital status: Married    Spouse name: N/A  . Number of children: N/A  . Years of education: N/A   Occupational History  . Not on file.   Social History Main Topics  . Smoking status: Never Smoker  . Smokeless tobacco: Never Used  . Alcohol use 2.5 oz/week    5 Standard drinks or equivalent per week  . Drug use: No  . Sexual activity: Yes    Birth control/ protection: Post-menopausal   Other Topics Concern  . Not on file   Social History Narrative  . No narrative on file    Family History  Problem Relation Age of Onset  . Melanoma Mother 25    left leg  . Heart disease Father     macular deg.  . Macular degeneration Father   . Macular degeneration Paternal Aunt   . Macular degeneration Paternal Uncle     No current facility-administered medications for this encounter.    Current Outpatient Prescriptions  Medication Sig Dispense Refill  . amitriptyline (ELAVIL) 10 MG tablet Take 1 tablet (10 mg total) by mouth at bedtime. 90 tablet 3  . CALCIUM PO Take by mouth daily.    . cholecalciferol (  VITAMIN D) 1000 UNITS tablet Take 1,000 Units by mouth daily.    . Cyanocobalamin (VITAMIN B-12 PO) Take 1,000 mg by mouth daily. Vitamin B12    . estradiol (ESTRACE VAGINAL) 0.1 MG/GM vaginal cream APPLY to urethra twice day. 42.5 g 3  . Estradiol (VAGIFEM) 10 MCG TABS vaginal tablet Place 1 tablet (10 mcg total) vaginally 2 (two) times a week. 24 tablet 4  . LORazepam (ATIVAN) 1 MG tablet Take 1 mg by mouth at bedtime as needed for sleep.     Marland Kitchen MIMVEY LO 0.5-0.1 MG tablet TAKE 1/2 TABLET DAILY 28 tablet 3  . Multiple Vitamins-Minerals (OCUVITE ADULT  50+) CAPS Take 1 capsule by mouth daily.       No Known Allergies  ROS: Constitutional:  No fevers, chills, sweats.  Weight stable Eyes:  No vision changes, No discharge HENT:  No sore throats, nasal drainage Lymph: No neck swelling, No bruising easily Pulmonary:  No cough, productive sputum CV: No orthopnea, PND  Patient walks 60 minutes for about 2 miles without difficulty.  No exertional chest/neck/shoulder/arm pain. GI: No personal nor family history of GI/colon cancer, inflammatory bowel disease, irritable bowel syndrome, allergy such as Celiac Sprue, dietary/dairy problems, colitis, ulcers nor gastritis.  No recent sick contacts/gastroenteritis.  No travel outside the country.  No changes in diet. Renal: No UTIs, No hematuria Genital:  No drainage, bleeding, masses Musculoskeletal: No severe joint pain.  Good ROM major joints Skin:  No sores or lesions.  No rashes Heme/Lymph:  No easy bleeding.  No swollen lymph nodes Neuro: No focal weakness/numbness.  No seizures Psych: No suicidal ideation.  No hallucinations  BP 144/77 (BP Location: Left Arm)   Pulse 68   Temp 98.5 F (36.9 C) (Oral)   Resp 16   Ht 5' 7"  (1.702 m)   Wt 59.9 kg (132 lb)   LMP 11/22/2003 (Approximate)   SpO2 95%   BMI 20.67 kg/m   Physical Exam: General: Pt awake/alert/oriented x4 in no major acute distress Eyes: PERRL, normal EOM. Sclera nonicteric Neuro: CN II-XII intact w/o focal sensory/motor deficits. Lymph: No head/neck/groin lymphadenopathy Psych:  No delerium/psychosis/paranoia.  Mildly anxious but consolable HENT: Normocephalic, Mucus membranes moist.  No thrush Neck: Supple, No tracheal deviation Chest: No pain.  Good respiratory excursion. CV:  Pulses intact.  Regular rhythm Abdomen: Moderately distended.  TTP R side.  NO peritonitis.  No incarcerated hernias. GEN:  No inguinal hernias, no lyphadenopthy Ext:  SCDs BLE.  No significant edema.  No cyanosis Skin: No petechiae / purpurea.   No major sores Musculoskeletal: No severe joint pain.  Good ROM major joints   Results:   Labs: Results for orders placed or performed during the hospital encounter of 08/25/15 (from the past 48 hour(s))  Urinalysis, Routine w reflex microscopic (not at [facility])     Status: Abnormal   Collection Time: 08/25/15 12:01 AM  Result Value Ref Range   Color, Urine YELLOW YELLOW   APPearance CLOUDY (A) CLEAR   Specific Gravity, Urine 1.010 1.005 - 1.030   pH 7.0 5.0 - 8.0   Glucose, UA NEGATIVE NEGATIVE mg/dL   Hgb urine dipstick NEGATIVE NEGATIVE   Bilirubin Urine NEGATIVE NEGATIVE   Ketones, ur NEGATIVE NEGATIVE mg/dL   Protein, ur NEGATIVE NEGATIVE mg/dL   Nitrite NEGATIVE NEGATIVE   Leukocytes, UA SMALL (A) NEGATIVE  Urine microscopic-add on     Status: Abnormal   Collection Time: 08/25/15 12:01 AM  Result Value Ref Range  Squamous Epithelial / LPF 0-5 (A) NONE SEEN   WBC, UA 0-5 0 - 5 WBC/hpf   RBC / HPF NONE SEEN 0 - 5 RBC/hpf   Bacteria, UA RARE (A) NONE SEEN   Urine-Other AMORPHOUS URATES/PHOSPHATES   CBC with Differential     Status: None   Collection Time: 08/25/15 12:50 AM  Result Value Ref Range   WBC 8.3 4.0 - 10.5 K/uL   RBC 4.10 3.87 - 5.11 MIL/uL   Hemoglobin 13.6 12.0 - 15.0 g/dL   HCT 40.1 36.0 - 46.0 %   MCV 97.8 78.0 - 100.0 fL   MCH 33.2 26.0 - 34.0 pg   MCHC 33.9 30.0 - 36.0 g/dL   RDW 12.5 11.5 - 15.5 %   Platelets 225 150 - 400 K/uL   Neutrophils Relative % 64 %   Neutro Abs 5.3 1.7 - 7.7 K/uL   Lymphocytes Relative 22 %   Lymphs Abs 1.8 0.7 - 4.0 K/uL   Monocytes Relative 9 %   Monocytes Absolute 0.7 0.1 - 1.0 K/uL   Eosinophils Relative 5 %   Eosinophils Absolute 0.4 0.0 - 0.7 K/uL   Basophils Relative 0 %   Basophils Absolute 0.0 0.0 - 0.1 K/uL  Comprehensive metabolic panel     Status: Abnormal   Collection Time: 08/25/15 12:50 AM  Result Value Ref Range   Sodium 139 135 - 145 mmol/L   Potassium 3.9 3.5 - 5.1 mmol/L   Chloride 99 (L) 101 -  111 mmol/L   CO2 31 22 - 32 mmol/L   Glucose, Bld 98 65 - 99 mg/dL   BUN 17 6 - 20 mg/dL   Creatinine, Ser 0.83 0.44 - 1.00 mg/dL   Calcium 9.7 8.9 - 10.3 mg/dL   Total Protein 6.2 (L) 6.5 - 8.1 g/dL   Albumin 3.5 3.5 - 5.0 g/dL   AST 20 15 - 41 U/L   ALT 21 14 - 54 U/L   Alkaline Phosphatase 45 38 - 126 U/L   Total Bilirubin 0.6 0.3 - 1.2 mg/dL   GFR calc non Af Amer >60 >60 mL/min   GFR calc Af Amer >60 >60 mL/min    Comment: (NOTE) The eGFR has been calculated using the CKD EPI equation. This calculation has not been validated in all clinical situations. eGFR's persistently <60 mL/min signify possible Chronic Kidney Disease.    Anion gap 9 5 - 15  Lipase, blood     Status: None   Collection Time: 08/25/15 12:50 AM  Result Value Ref Range   Lipase 28 11 - 51 U/L    Imaging / Studies: Ct Abdomen Pelvis W Contrast  Result Date: 08/25/2015 CLINICAL DATA:  Right-sided abdominal pain for 12 hours.  Nausea. EXAM: CT ABDOMEN AND PELVIS WITH CONTRAST TECHNIQUE: Multidetector CT imaging of the abdomen and pelvis was performed using the standard protocol following bolus administration of intravenous contrast. CONTRAST:  145m ISOVUE-300 IOPAMIDOL (ISOVUE-300) INJECTION 61% COMPARISON:  None. FINDINGS: Lower chest: Minimal scarring in the lingula and right middle lobe. Small nodular densities dependently in the right lower lobe measuring up to 6 mm. Liver: There is a 1.8 cm cyst within segment 6 of the liver. No suspicious solid lesion. Hepatobiliary: Gallbladder physiologically distended, no calcified stone. No biliary dilatation. Pancreas: No ductal dilatation or inflammation. Spleen: Normal. Adrenal glands: No nodule. Kidneys: Symmetric renal enhancement. There is prominence of the right and left renal pelvis without perinephric edema, likely extrarenal pelvis configuration. Symmetric renal excretion on  delayed phase imaging. No definite ureteral dilatation. There is a retro aortic left renal  vein. Stomach/Bowel: Stomach distended with ingested contrast, and displaced laterally by the cecum. The cecum is dilated with twisting of the mesentery suspicious for cecal volvulus. The appendix is seen arising from the cecum. No definite cecal wall thickening. No pneumatosis or signs of bowel ischemia. There is edema in the mesenteries the right lower quadrant with small amounts of free fluid tracking in the right pericolic gutter and in the pelvis. There is a moderate stool burden throughout the remainder of the colon. No small bowel dilatation. Vascular/Lymphatic: No retroperitoneal adenopathy. Abdominal aorta is normal in caliber. Reproductive: Uterus remains in situ and is unremarkable. No adnexal mass, ovaries not confidently visualized Bladder: Physiologically distended.  No wall thickening. Other: No free air, pneumatosis or portal venous gas. Small amount of free fluid in the right lower quadrant, pelvis and pericolic gutters. No intra-abdominal abscess. Musculoskeletal: There sclerotic densities within both iliac crests that have benign features better not classic for bone islands. Degenerative disc disease at L5-S1. IMPRESSION: Findings consistent with cecal volvulus. No evidence of bowel ischemia, perforation, or obstruction. These results were called by telephone at the time of interpretation on 08/25/2015 at 3:27 am to Dr. Veryl Speak , who verbally acknowledged these results. Electronically Signed   By: Jeb Levering M.D.   On: 08/25/2015 03:27    Medications / Allergies: per chart  Antibiotics: Anti-infectives    None      Assessment  Madison Carr  65 y.o. female       Problem List:  Principal Problem:   [name] (HCC) Active Problems:   Fibromyalgia   [name]  Plan:  -Emergent colectomy:  The anatomy & physiology of the digestive tract was discussed.  The pathophysiology of the colon was discussed.  Natural history risks without surgery was discussed.    I feel the risks of no intervention will lead to serious problems that outweigh the operative risks; therefore, I recommended a partial colectomy to remove the pathology.  Minimally invasive (Robotic/Laparoscopic) & open techniques were discussed.   Risks such as bleeding, infection, abscess, leak, reoperation, injury to other organs, need for repair of tissues / organs, possible ostomy, hernia, heart attack, stroke, death, and other risks were discussed.  I noted a good likelihood this will help address the problem.   Goals of post-operative recovery were discussed as well.   Need for adequate nutrition, daily bowel regimen and healthy physical activity, to optimize recovery was noted as well. We will work to minimize complications.  Educational materials were available as well.  Questions were answered.  The patient expresses understanding & wishes to proceed with surgery.  She does not look toxic and there is no evidence of pneumatosis or perforation.  Hopefully be able to do primary anastomosis.  Discussed with Dr. Zella Richer who will assume care.  Will consider minimally invasive approach versus open approach.   -IV ABx -IVF -VTE prophylaxis- SCDs, etc -mobilize as tolerated to help recovery    Adin Hector, M.D., F.A.C.S. [facility]astrointestinal [facility]ally [facility] [address], [place], [place] [address] [phone] Main / Paging   08/25/2015  Note: Portions of this report may have been transcribed using voice recognition software. Every effort was made to ensure accuracy; however, inadvertent computerized transcription errors may be present.   Any transcriptional errors that result from this process are unintentional.

## 2015-08-26 LAB — BASIC METABOLIC PANEL
ANION GAP: 4 — AB (ref 5–15)
BUN: 15 mg/dL (ref 6–20)
CALCIUM: 7.8 mg/dL — AB (ref 8.9–10.3)
CHLORIDE: 105 mmol/L (ref 101–111)
CO2: 30 mmol/L (ref 22–32)
CREATININE: 0.87 mg/dL (ref 0.44–1.00)
GFR calc non Af Amer: >60 mL/min (ref >60)
Glucose, Bld: 157 mg/dL — ABNORMAL HIGH (ref 65–99)
Potassium: 4.8 mmol/L (ref 3.5–5.1)
SODIUM: 139 mmol/L (ref 135–145)

## 2015-08-26 LAB — HEMOGLOBIN A1C
Hgb A1c MFr Bld: 5 % (ref 4.8–5.6)
Mean Plasma Glucose: 97 mg/dL

## 2015-08-26 LAB — CBC
HCT: 30.6 % — ABNORMAL LOW (ref 36.0–46.0)
Hemoglobin: 10.3 g/dL — ABNORMAL LOW (ref 12.0–15.0)
MCH: 33.7 pg (ref 26.0–34.0)
MCHC: 33.7 g/dL (ref 30.0–36.0)
MCV: 100 fL (ref 78.0–100.0)
PLATELETS: 240 10*3/uL (ref 150–400)
RBC: 3.06 MIL/uL — AB (ref 3.87–5.11)
RDW: 13.5 % (ref 11.5–15.5)
WBC: 11.5 10*3/uL — AB (ref 4.0–10.5)

## 2015-08-26 LAB — URINE CULTURE: Culture: <10000 — AB

## 2015-08-26 NOTE — Progress Notes (Signed)
Patient ID: Madison Carr, female   DOB: 06-Mar-1950, 65 y.o.   MRN: [id number]   [facility]  Subjective: POD#1 Patient in bed, sore.  No nausea or emesis.  Objective: Vital signs in last 24 hours: Temp:  [97.9 F (36.6 C)-98.8 F (37.1 C)] 97.9 F (36.6 C) (08/05 0524) Pulse Rate:  [65-87] 82 (08/05 0524) Resp:  [10-17] 14 (08/05 0730) BP: (133-172)/(56-83) 150/56 (08/05 0524) SpO2:  [98 %-100 %] 100 % (08/05 0730)    Intake/Output from previous day: 08/04 0701 - 08/05 0700 In: 3852.1 [I.V.:3852.1] Out: 2325 [Urine:1475; Blood:250] Intake/Output this shift: No intake/output data recorded.  Physical Exam: HEENT - sclerae clear, mucous membranes moist Neck - soft Abdomen - soft without distension; BS present; dressing dry and intact Ext - no edema, non-tender Neuro - alert & oriented, no focal deficits  Lab Results:   Recent Labs  08/25/15 0050 08/26/15 0415  WBC 8.3 11.5*  HGB 13.6 10.3*  HCT 40.1 30.6*  PLT 225 240   BMET  Recent Labs  08/25/15 0050 08/26/15 0415  NA 139 139  K 3.9 4.8  CL 99* 105  CO2 31 30  GLUCOSE 98 157*  BUN 17 15  CREATININE 0.83 0.87  CALCIUM 9.7 7.8*   PT/INR No results for input(s): LABPROT, INR in the last 72 hours. Comprehensive Metabolic Panel:    Component Value Date/Time   NA 139 08/26/2015 0415   NA 139 08/25/2015 0050   K 4.8 08/26/2015 0415   K 3.9 08/25/2015 0050   CL 105 08/26/2015 0415   CL 99 (L) 08/25/2015 0050   CO2 30 08/26/2015 0415   CO2 31 08/25/2015 0050   BUN 15 08/26/2015 0415   BUN 17 08/25/2015 0050   CREATININE 0.87 08/26/2015 0415   CREATININE 0.83 08/25/2015 0050   GLUCOSE 157 (H) 08/26/2015 0415   GLUCOSE 98 08/25/2015 0050   CALCIUM 7.8 (L) 08/26/2015 0415   CALCIUM 9.7 08/25/2015 0050   AST 20 08/25/2015 0050   ALT 21 08/25/2015 0050   ALKPHOS 45 08/25/2015 0050   BILITOT 0.6 08/25/2015 0050   PROT 6.2 (L) 08/25/2015 0050   ALBUMIN 3.5  08/25/2015 0050    Studies/Results: Ct Abdomen Pelvis W Contrast  Result Date: 08/25/2015 CLINICAL DATA:  Right-sided abdominal pain for 12 hours.  Nausea. EXAM: CT ABDOMEN AND PELVIS WITH CONTRAST TECHNIQUE: Multidetector CT imaging of the abdomen and pelvis was performed using the standard protocol following bolus administration of intravenous contrast. CONTRAST:  176mL ISOVUE-300 IOPAMIDOL (ISOVUE-300) INJECTION 61% COMPARISON:  None. FINDINGS: Lower chest: Minimal scarring in the lingula and right middle lobe. Small nodular densities dependently in the right lower lobe measuring up to 6 mm. Liver: There is a 1.8 cm cyst within segment 6 of the liver. No suspicious solid lesion. Hepatobiliary: Gallbladder physiologically distended, no calcified stone. No biliary dilatation. Pancreas: No ductal dilatation or inflammation. Spleen: Normal. Adrenal glands: No nodule. Kidneys: Symmetric renal enhancement. There is prominence of the right and left renal pelvis without perinephric edema, likely extrarenal pelvis configuration. Symmetric renal excretion on delayed phase imaging. No definite ureteral dilatation. There is a retro aortic left renal vein. Stomach/Bowel: Stomach distended with ingested contrast, and displaced laterally by the cecum. The cecum is dilated with twisting of the mesentery suspicious for cecal volvulus. The appendix is seen arising from the cecum. No definite cecal wall thickening. No pneumatosis or signs of bowel ischemia. There is edema in the mesenteries  the right lower quadrant with small amounts of free fluid tracking in the right pericolic gutter and in the pelvis. There is a moderate stool burden throughout the remainder of the colon. No small bowel dilatation. Vascular/Lymphatic: No retroperitoneal adenopathy. Abdominal aorta is normal in caliber. Reproductive: Uterus remains in situ and is unremarkable. No adnexal mass, ovaries not confidently visualized Bladder: Physiologically  distended.  No wall thickening. Other: No free air, pneumatosis or portal venous gas. Small amount of free fluid in the right lower quadrant, pelvis and pericolic gutters. No intra-abdominal abscess. Musculoskeletal: There sclerotic densities within both iliac crests that have benign features better not classic for bone islands. Degenerative disc disease at L5-S1. IMPRESSION: Findings consistent with cecal volvulus. No evidence of bowel ischemia, perforation, or obstruction. These results were called by telephone at the time of interpretation on 08/25/2015 at 3:27 am to Dr. Veryl Speak , who verbally acknowledged these results. Electronically Signed   By: Jeb Levering M.D.   On: 08/25/2015 03:27    Assessment & Plans: Status post right colectomy for cecal volvulus  Begin clear liquid diet  OOB, ambulate in halls  Pain Rx  Earnstine Regal, MD, [facility] Office: [place] 08/26/2015

## 2015-08-27 MED ORDER — PROMETHAZINE HCL 25 MG/ML IJ SOLN
12.5000 mg | INTRAMUSCULAR | Status: DC | PRN
  Administered 2015-08-27 – 2015-08-30 (×5): 12.5 mg via INTRAVENOUS
  Filled 2015-08-27 (×5): qty 1

## 2015-08-27 NOTE — Progress Notes (Signed)
Patient ID: Madison Carr, female   DOB: 1950-08-19, 65 y.o.   MRN: [id number]  [facility]  Subjective: POD#2  Patient in bed, ambulatory.  Foley out this AM.  No flatus or BM yet.  Taking limited clear liquids.  Objective: Vital signs in last 24 hours: Temp:  [97.9 F (36.6 C)-98.9 F (37.2 C)] 98.9 F (37.2 C) (08/06 0603) Pulse Rate:  [56-92] 56 (08/06 0603) Resp:  [14-18] 16 (08/06 0800) BP: (129-159)/(62-74) 159/74 (08/06 0603) SpO2:  [95 %-100 %] 95 % (08/06 0800) Last BM Date: 08/24/15  Intake/Output from previous day: 08/05 0701 - 08/06 0700 In: 3000 [I.V.:3000] Out: 900 [Urine:900] Intake/Output this shift: No intake/output data recorded.  Physical Exam: HEENT - sclerae clear, mucous membranes moist Neck - soft Chest - clear bilaterally Cor - RRR Abdomen - soft, mild distension; few BS present; wound dry and intact Ext - no edema, non-tender Neuro - alert & oriented, no focal deficits  Lab Results:   Recent Labs  08/25/15 0050 08/26/15 0415  WBC 8.3 11.5*  HGB 13.6 10.3*  HCT 40.1 30.6*  PLT 225 240   BMET  Recent Labs  08/25/15 0050 08/26/15 0415  NA 139 139  K 3.9 4.8  CL 99* 105  CO2 31 30  GLUCOSE 98 157*  BUN 17 15  CREATININE 0.83 0.87  CALCIUM 9.7 7.8*   PT/INR No results for input(s): LABPROT, INR in the last 72 hours. Comprehensive Metabolic Panel:    Component Value Date/Time   NA 139 08/26/2015 0415   NA 139 08/25/2015 0050   K 4.8 08/26/2015 0415   K 3.9 08/25/2015 0050   CL 105 08/26/2015 0415   CL 99 (L) 08/25/2015 0050   CO2 30 08/26/2015 0415   CO2 31 08/25/2015 0050   BUN 15 08/26/2015 0415   BUN 17 08/25/2015 0050   CREATININE 0.87 08/26/2015 0415   CREATININE 0.83 08/25/2015 0050   GLUCOSE 157 (H) 08/26/2015 0415   GLUCOSE 98 08/25/2015 0050   CALCIUM 7.8 (L) 08/26/2015 0415   CALCIUM 9.7 08/25/2015 0050   AST 20 08/25/2015 0050   ALT 21 08/25/2015 0050   ALKPHOS 45  08/25/2015 0050   BILITOT 0.6 08/25/2015 0050   PROT 6.2 (L) 08/25/2015 0050   ALBUMIN 3.5 08/25/2015 0050    Studies/Results: No results found.  Assessment & Plans: Status post right colectomy for cecal volvulus                       Continue clear liquid diet                       OOB, ambulate in halls                       Pain Rx - continue [place], MD, [facility] Office: [place] 08/27/2015

## 2015-08-28 MED ORDER — DIPHENHYDRAMINE HCL 12.5 MG/5ML PO ELIX: 12.5000 mg | ORAL_SOLUTION | Freq: Four times a day (QID) | ORAL | Status: DC | PRN

## 2015-08-28 MED ORDER — SODIUM CHLORIDE 0.9% FLUSH: 9.0000 mL | INTRAVENOUS | Status: DC | PRN

## 2015-08-28 MED ORDER — OXYCODONE-ACETAMINOPHEN 5-325 MG PO TABS: 1.0000 | ORAL_TABLET | ORAL | Status: DC | PRN

## 2015-08-28 MED ORDER — NALOXONE HCL 0.4 MG/ML IJ SOLN: 0.4000 mg | INTRAMUSCULAR | Status: DC | PRN

## 2015-08-28 MED ORDER — HYDROMORPHONE 1 MG/ML IV SOLN
INTRAVENOUS | Status: DC
  Administered 2015-08-28 (×2): 2.4 mg via INTRAVENOUS
  Administered 2015-08-28: 13:00:00 via INTRAVENOUS
  Administered 2015-08-29: 3 mg via INTRAVENOUS
  Administered 2015-08-29: 2.4 mg via INTRAVENOUS
  Administered 2015-08-29: 1.8 mg via INTRAVENOUS
  Filled 2015-08-28: qty 25

## 2015-08-28 MED ORDER — ONDANSETRON HCL 4 MG/2ML IJ SOLN
4.0000 mg | Freq: Four times a day (QID) | INTRAMUSCULAR | Status: DC | PRN
  Filled 2015-08-28: qty 2

## 2015-08-28 MED ORDER — DIPHENHYDRAMINE HCL 50 MG/ML IJ SOLN
12.5000 mg | Freq: Four times a day (QID) | INTRAMUSCULAR | Status: DC | PRN
  Administered 2015-08-28: 12.5 mg via INTRAVENOUS
  Filled 2015-08-28: qty 1

## 2015-08-28 NOTE — Progress Notes (Addendum)
[facility] Office:  [phone] General Surgery Progress Note   LOS: 4 days  POD -  4 Days Post-Op  Assessment/Plan: 1.  Diagnostic LAPAROSCOPY, Extended right COLECTOMY - 08/25/2015 - [facility]  For cecal volvulus  On clear liquids, but not much bowel function so far.  Will hold diet.  2.   - this is a part of her diagnosis, but is incorrect 3.  DVT prophylaxis - SQ Heparin   Principal Problem:   [name] (HCC) Active Problems:   History of colonic polyps  Subjective:  Has had some trouble with PCA morphine.  Will try dilaudid.  Husband Barnabas Lister in room.  Objective:   Vitals:   08/29/15 0953 08/29/15 1452  BP: (!) 135/57 (!) 150/66  Pulse: 79 93  Resp: 16 16  Temp: 98.7 F (37.1 C) 99 F (37.2 C)     Intake/Output from previous day:  08/07 0701 - 08/08 0700 In: 4080 [P.O.:1080; I.V.:3000] Out: 3100 [Urine:3100]  Intake/Output this shift:  No intake/output data recorded.   Physical Exam:   General: WN WF who is alert and oriented.    HEENT: Normal. Pupils equal. .   Lungs: Clear, though IS only 700 cc   Abdomen: Mild distention, has a few BS.   Wound: Clean   Lab Results:   No results for input(s): WBC, HGB, HCT, PLT in the last 72 hours.  BMET  No results for input(s): NA, K, CL, CO2, GLUCOSE, BUN, CREATININE, CALCIUM in the last 72 hours.  PT/INR  No results for input(s): LABPROT, INR in the last 72 hours.  ABG  No results for input(s): PHART, HCO3 in the last 72 hours.  Invalid input(s): PCO2, PO2   Studies/Results:  No results found.   Anti-infectives:   Anti-infectives    Start     Dose/Rate Route Frequency Ordered Stop   08/25/15 2200  cefoTEtan (CEFOTAN) 2 g in dextrose 5 % 50 mL IVPB     2 g 100 mL/hr over 30 Minutes Intravenous Every 12 hours 08/25/15 1337 08/25/15 2225   08/25/15 1118  clindamycin (CLEOCIN) 900 mg, gentamicin (GARAMYCIN) 240 mg in sodium chloride 0.9 % 1,000 mL for intraperitoneal lavage  Status:   Discontinued       As needed 08/25/15 1118 08/25/15 1126   08/25/15 0830  clindamycin (CLEOCIN) 900 mg, gentamicin (GARAMYCIN) 240 mg in sodium chloride 0.9 % 1,000 mL for intraperitoneal lavage  Status:  Discontinued      Intraperitoneal To Surgery 08/25/15 0740 08/25/15 1337   08/25/15 0745  cefoTEtan (CEFOTAN) 2 g in dextrose 5 % 50 mL IVPB     2 g 100 mL/hr over 30 Minutes Intravenous On call to O.R. 08/25/15 0740 08/25/15 [id number]      Alphonsa Overall, MD, FACS Pager: [facility] Office: [phone] 08/29/2015

## 2015-08-29 MED ORDER — HYDROMORPHONE 1 MG/ML IV SOLN: INTRAVENOUS | Status: DC

## 2015-08-29 MED ORDER — DIPHENHYDRAMINE HCL 12.5 MG/5ML PO ELIX: 12.5000 mg | ORAL_SOLUTION | Freq: Four times a day (QID) | ORAL | Status: DC | PRN

## 2015-08-29 MED ORDER — NALOXONE HCL 0.4 MG/ML IJ SOLN: 0.4000 mg | INTRAMUSCULAR | Status: DC | PRN

## 2015-08-29 MED ORDER — DIPHENHYDRAMINE HCL 50 MG/ML IJ SOLN: 12.5000 mg | Freq: Four times a day (QID) | INTRAMUSCULAR | Status: DC | PRN

## 2015-08-29 MED ORDER — ONDANSETRON HCL 4 MG/2ML IJ SOLN: 4.0000 mg | Freq: Four times a day (QID) | INTRAMUSCULAR | Status: DC | PRN

## 2015-08-29 MED ORDER — SODIUM CHLORIDE 0.9% FLUSH: 9.0000 mL | INTRAVENOUS | Status: DC | PRN

## 2015-08-29 MED ORDER — ACETAMINOPHEN 500 MG PO TABS
1000.0000 mg | ORAL_TABLET | Freq: Four times a day (QID) | ORAL | Status: DC
  Administered 2015-08-29 – 2015-09-02 (×12): 1000 mg via ORAL
  Filled 2015-08-29 (×13): qty 2

## 2015-08-29 MED ORDER — OXYCODONE HCL 5 MG PO TABS
5.0000 mg | ORAL_TABLET | ORAL | Status: DC | PRN
  Administered 2015-08-29 – 2015-08-31 (×9): 10 mg via ORAL
  Administered 2015-08-31: 5 mg via ORAL
  Administered 2015-08-31 (×3): 10 mg via ORAL
  Administered 2015-08-31: 5 mg via ORAL
  Administered 2015-09-01 – 2015-09-02 (×7): 10 mg via ORAL
  Filled 2015-08-29 (×7): qty 2
  Filled 2015-08-29: qty 1
  Filled 2015-08-29 (×8): qty 2
  Filled 2015-08-29: qty 1
  Filled 2015-08-29 (×4): qty 2

## 2015-08-29 NOTE — Progress Notes (Addendum)
4 Days Post-Op  Subjective: Her husband says she is a little "loopy". She was switched from morphine to Dilaudid yesterday. She did not think the morphine was working for her. She is tolerating the clear liquids, but does not like them.   She still not having much in the way of flatus. Complains of trouble sleeping.  Objective: Vital signs in last 24 hours: Temp:  [98.6 F (37 C)-99.1 F (37.3 C)] 98.7 F (37.1 C) (08/08 0953) Pulse Rate:  [78-93] 79 (08/08 0953) Resp:  [16-20] 16 (08/08 0953) BP: (132-154)/(56-69) 135/57 (08/08 0953) SpO2:  [95 %-100 %] 98 % (08/08 0953) Last BM Date: 08/24/15 1080 by mouth Urine 3100 Afebrile vital signs are stable No labs since 8/5/7 No films Intake/Output from previous day: 08/07 0701 - 08/08 0700 In: 4080 [P.O.:1080; I.V.:3000] Out: 3100 [Urine:3100] Intake/Output this shift: Total I/O In: 240 [P.O.:240] Out: 400 [Urine:400]  General appearance: alert, cooperative and no distress Resp: clear to auscultation bilaterally GI: Soft, rare bowel sounds, no distention. Incision looks fine, waffle dressing still in place. No flatus to speak of so far  Lab Results:  No results for input(s): WBC, HGB, HCT, PLT in the last 72 hours.  BMET No results for input(s): NA, K, CL, CO2, GLUCOSE, BUN, CREATININE, CALCIUM in the last 72 hours. PT/INR No results for input(s): LABPROT, INR in the last 72 hours.   Recent Labs Lab 08/25/15 0050  AST 20  ALT 21  ALKPHOS 45  BILITOT 0.6  PROT 6.2*  ALBUMIN 3.5     Lipase     Component Value Date/Time   LIPASE 28 08/25/2015 0050     Studies/Results: No results found. Prior to Admission medications   Medication Sig Start Date End Date Taking? Authorizing Provider  amitriptyline (ELAVIL) 10 MG tablet Take 1 tablet (10 mg total) by mouth at bedtime. 09/30/14  Yes Kem Boroughs, FNP  CALCIUM PO Take by mouth daily.   Yes Historical Provider, MD  cholecalciferol (VITAMIN D) 1000 UNITS tablet  Take 1,000 Units by mouth daily.   Yes Historical Provider, MD  Cyanocobalamin (VITAMIN B-12 PO) Take 1,000 mg by mouth daily. Vitamin B12   Yes Historical Provider, MD  estradiol (ESTRACE VAGINAL) 0.1 MG/GM vaginal cream APPLY to urethra twice day. 09/30/14  Yes Kem Boroughs, FNP  Estradiol (VAGIFEM) 10 MCG TABS vaginal tablet Place 1 tablet (10 mcg total) vaginally 2 (two) times a week. 09/30/14  Yes Kem Boroughs, FNP  LORazepam (ATIVAN) 1 MG tablet Take 1 mg by mouth at bedtime as needed for sleep.  08/29/12  Yes Historical Provider, MD  MIMVEY LO 0.5-0.1 MG tablet TAKE 1/2 TABLET DAILY 06/26/15  Yes Brook E Yisroel Ramming, MD  Multiple Vitamins-Minerals [facility] ADULT 50+) CAPS Take 1 capsule by mouth daily.   Yes Historical Provider, MD    Medications:  Scheduled Meds: . alvimopan  12 mg Oral BID  . heparin subcutaneous  5,000 Units Subcutaneous Q8H  . HYDROmorphone   Intravenous Q4H   Continuous Infusions: . dextrose 5 % and 0.9 % NaCl with KCl 20 mEq/L 125 mL/hr at 08/28/15 0911    PRN Meds:.diphenhydrAMINE **OR** diphenhydrAMINE, LORazepam, naloxone **AND** sodium chloride flush, ondansetron **OR** ondansetron (ZOFRAN) IV, ondansetron (ZOFRAN) IV, oxyCODONE-acetaminophen, promethazine  Assessment/Plan [name]  Emergent diagnostic laparoscopy, converted to exploratory laparotomy with extended right colectomy, 08/25/15, Dr. Jackolyn Confer   POD 4 Pain control - confused this a.m. with Dilaudid/felt morphine wasn't effective. FEN:  IV/ fluids/clear ID:  Preadmitted antibiotics only DVT:  Heparin/SCD   Plan: I told her husband to get her a smoothly, I will decrease her to a low-dose PCA with Dilaudid. I offered to discontinue the Dilaudid to let the nurses give her but the husband was not really happy with that suggestion. Continue to mobilize. Home meds shows she is on Ativan 1 mg daily at bedtime at home. Recheck labs in a.m.    LOS: 4 days     JENNINGS,WILLARD 08/29/2015 [phone]  Agree with above. Had 2 small BMs.  Feels better. Husband and daughter in room. May shower.  Alphonsa Overall, MD, [facility] Surgery Pager: [phone] Office phone:  [phone]

## 2015-08-30 LAB — BASIC METABOLIC PANEL
Anion gap: 5 (ref 5–15)
BUN: 7 mg/dL (ref 6–20)
CALCIUM: 8 mg/dL — AB (ref 8.9–10.3)
CHLORIDE: 105 mmol/L (ref 101–111)
CO2: 28 mmol/L (ref 22–32)
CREATININE: 0.58 mg/dL (ref 0.44–1.00)
GFR calc non Af Amer: >60 mL/min (ref >60)
Glucose, Bld: 104 mg/dL — ABNORMAL HIGH (ref 65–99)
Potassium: 3.5 mmol/L (ref 3.5–5.1)
SODIUM: 138 mmol/L (ref 135–145)

## 2015-08-30 LAB — CBC
HCT: 27.5 % — ABNORMAL LOW (ref 36.0–46.0)
Hemoglobin: 9.1 g/dL — ABNORMAL LOW (ref 12.0–15.0)
MCH: 33.2 pg (ref 26.0–34.0)
MCHC: 33.1 g/dL (ref 30.0–36.0)
MCV: 100.4 fL — ABNORMAL HIGH (ref 78.0–100.0)
Platelets: 220 10*3/uL (ref 150–400)
RBC: 2.74 MIL/uL — ABNORMAL LOW (ref 3.87–5.11)
RDW: 13.8 % (ref 11.5–15.5)
WBC: 7 10*3/uL (ref 4.0–10.5)

## 2015-08-30 NOTE — Progress Notes (Signed)
5 Days Post-Op  Subjective: She looks allot better this AM. Off IV narcotics.  Started full liquids, has a smear of stool but not really much.  She was walking with her husband earlier this AM.  Site looks great.    Objective: Vital signs in last 24 hours: Temp:  [99 F (37.2 C)-99.4 F (37.4 C)] 99.1 F (37.3 C) (08/09 0629) Pulse Rate:  [88-93] 89 (08/09 0629) Resp:  [16] 16 (08/09 0629) BP: (135-150)/(65-68) 140/65 (08/09 0629) SpO2:  [98 %-100 %] 98 % (08/09 0629) Last BM Date: 08/24/15 960 PO Urine 400  Afebrile, TM 99.4 VSS Labs OK  Intake/Output from previous day: 08/08 0701 - 08/09 0700 In: 2835 [P.O.:960; I.V.:1875] Out: 400 [Urine:400] Intake/Output this shift: No intake/output data recorded.  General appearance: alert, cooperative and no distress Resp: clear to auscultation bilaterally GI: soft sore, + BS. flatus and smear of stool.  site looks good.  Lab Results:   Recent Labs  08/30/15 0449  WBC 7.0  HGB 9.1*  HCT 27.5*  PLT 220    BMET  Recent Labs  08/30/15 0449  NA 138  K 3.5  CL 105  CO2 28  GLUCOSE 104*  BUN 7  CREATININE 0.58  CALCIUM 8.0*   PT/INR No results for input(s): LABPROT, INR in the last 72 hours.   Recent Labs Lab 08/25/15 0050  AST 20  ALT 21  ALKPHOS 45  BILITOT 0.6  PROT 6.2*  ALBUMIN 3.5     Lipase     Component Value Date/Time   LIPASE 28 08/25/2015 0050     Studies/Results: No results found.  Medications: . acetaminophen  1,000 mg Oral Q6H  . alvimopan  12 mg Oral BID  . heparin subcutaneous  5,000 Units Subcutaneous Q8H    Assessment/Plan [name]                       Emergent diagnostic laparoscopy, converted to exploratory laparotomy with extended right colectomy, 08/25/15, Dr. Jackolyn Confer   POD 4 Pain control - confused this a.m. with Dilaudid/felt morphine wasn't effective. FEN:  IV/ fluids/full liquids ID:  Preadmitted antibiotics only DVT:  Heparin/SCD   Plan:  Soft diet  this PM, if she does well home 24-48 hours.       LOS: 5 days    JENNINGS,WILLARD 08/30/2015 [phone]  Agree with above. Looks better.  Still not much in the way of BM. Husband and friend in a room.  Alphonsa Overall, MD, [facility] Surgery Pager: [phone] Office phone:  [phone]

## 2015-08-31 MED ORDER — AMITRIPTYLINE HCL 10 MG PO TABS
10.0000 mg | ORAL_TABLET | Freq: Every day | ORAL | Status: DC
  Administered 2015-08-31 – 2015-09-01 (×2): 10 mg via ORAL
  Filled 2015-08-31 (×2): qty 1

## 2015-08-31 NOTE — Discharge Summary (Signed)
Physician Discharge Summary  Patient ID: Madison Carr MRN: [id number] DOB/AGE: 1950-03-14 65 y.o.  Admit date: 08/25/2015 Discharge date: 09/02/2015  Admission Diagnoses:  [name] Discharge Diagnoses:  [name]                      Principal Problem:   [name] [facility]) Active Problems:   History of colonic polyps   PROCEDURES:    Emergent diagnostic laparoscopy, converted to exploratory laparotomy with extended right colectomy, 08/25/15, Dr. Lenice Llamas Course:  Pleasant active woman.  History of colon polyps.  Usually gets polypectomies every five years.  Sharp abdominal pain on the right side yesterday afternoon.  Worsen.  Went to the emergency department at [place].  An concerning for right sided volvulus.  Surgical consultation requested. Patient feels bloated.  No flatus or bowel movement since this happened.  Normally has a bowel movement every day.  She has not had any abdominal surgery.  She exercises and plays tennis quite regularly.  She does not smoke.  No diabetes.  No cardiopulmonary problems.  Not on blood thinners.  Husband at bedside. No personal nor family history of GI/colon cancer, inflammatory bowel disease, irritable bowel syndrome, allergy such as Celiac Sprue, dietary/dairy problems, colitis, ulcers nor gastritis.  No recent sick contacts/gastroenteritis.  No travel outside the country.  No changes in diet.  No dysphagia to solids or liquids.  No significant heartburn or reflux.  No hematochezia, hematemesis, coffee ground emesis.  No evidence of prior gastric/peptic ulceration. She was transferred to [facility] and admitted by Dr. Johney Maine with a [name].  She was seen the following AM and taken to the OR.  Procedure as noted above.   Post op she did have some pain issues, but more because of trouble with analgesics, some mild post op ileus and then she had to have the wound opened some in the midline.   By the 8th  post op day she was ready for discharge.  We set her up with home health and follow up with Dr. Zella Richer the 14th post op day so he could get remaining staples out.   CBC Latest Ref Rng & Units 08/30/2015 08/26/2015 08/25/2015  WBC 4.0 - 10.5 K/uL 7.0 11.5(H) 8.3  Hemoglobin 12.0 - 15.0 g/dL 9.1(L) 10.3(L) 13.6  Hematocrit 36.0 - 46.0 % 27.5(L) 30.6(L) 40.1  Platelets 150 - 400 K/uL 220 240 225   CMP Latest Ref Rng & Units 08/30/2015 08/26/2015 08/25/2015  Glucose 65 - 99 mg/dL 104(H) 157(H) 98  BUN 6 - 20 mg/dL 7 15 17   Creatinine 0.44 - 1.00 mg/dL 0.58 0.87 0.83  Sodium 135 - 145 mmol/L 138 139 139  Potassium 3.5 - 5.1 mmol/L 3.5 4.8 3.9  Chloride 101 - 111 mmol/L 105 105 99(L)  CO2 22 - 32 mmol/L 28 30 31   Calcium 8.9 - 10.3 mg/dL 8.0(L) 7.8(L) 9.7  Total Protein 6.5 - 8.1 g/dL - - 6.2(L)  Total Bilirubin 0.3 - 1.2 mg/dL - - 0.6  Alkaline Phos 38 - 126 U/L - - 45  AST 15 - 41 U/L - - 20  ALT 14 - 54 U/L - - 21   Condition on d/C:  Improved   Disposition: 01-Home or Self Care     Medication List    TAKE these medications   amitriptyline 10 MG tablet Commonly known as:  ELAVIL Take 1 tablet (10 mg total)  by mouth at bedtime.   CALCIUM PO Take by mouth daily.   cholecalciferol 1000 units tablet Commonly known as:  VITAMIN D Take 1,000 Units by mouth daily.   Estradiol 10 MCG Tabs vaginal tablet Commonly known as:  VAGIFEM Place 1 tablet (10 mcg total) vaginally 2 (two) times a week.   estradiol 0.1 MG/GM vaginal cream Commonly known as:  ESTRACE VAGINAL APPLY to urethra twice day.   LORazepam 1 MG tablet Commonly known as:  ATIVAN Take 1 mg by mouth at bedtime as needed for sleep.   MIMVEY LO 0.5-0.1 MG tablet Generic drug:  Estradiol-Norethindrone Acet TAKE 1/2 TABLET DAILY   OCUVITE ADULT 50+ Caps Take 1 capsule by mouth daily.   oxyCODONE 5 MG immediate release tablet Commonly known as:  Oxy IR/ROXICODONE Take 1-2 tablets (5-10 mg total) by mouth every 4 (four)  hours as needed for moderate pain or severe pain (TRY TO GET HER ON po PAIN MEDS SO WE CAN GET RID OF PCA).   VITAMIN B-12 PO Take 1,000 mg by mouth daily. Vitamin B12      Follow-up Information    Odis Hollingshead, MD Follow up on 09/08/2015.   Specialty:  General Surgery Why:  Your appointment is at 11:15 AM, be at the office 30 minutes early for check in. Contact information: [address] [place] [address] [phone]        [facility] .   Why:  nurse for wound care Contact information: [address] [place] [address] [phone]           Signed: Earnstine Regal 09/04/2015, 4:30 PM  Agree with above. Thought anxious about wound, she is doing much better. She was actually seen by Dr. Zella Richer on 09/02/2015.  Alphonsa Overall, MD, [facility] Surgery Pager: [phone] Office phone:  [phone]

## 2015-08-31 NOTE — Progress Notes (Signed)
6 Days Post-Op  Subjective: She had a lot of visitors yesterday, didn't walk as much. She also had issues with nausea she didn't eat dinner last night secondary to that. She feels better this a.m. but has not eaten. She did have one bowel movement. She also has some erythema and induration beginning in the midportion of her abdominal suture line.  Objective: Vital signs in last 24 hours: Temp:  [98.2 F (36.8 C)-99.4 F (37.4 C)] 99.2 F (37.3 C) (08/10 0500) Pulse Rate:  [80-91] 81 (08/10 0500) Resp:  [16-18] 18 (08/10 0500) BP: (132-147)/(52-71) 140/71 (08/10 0500) SpO2:  [100 %] 100 % (08/10 0500) Last BM Date: 08/24/15 360 PO Voided 5 BM 1 recorded. MAXIMUM TEMPERATURE 99.4 VSS No labs today labs yesterday were stable. Intake/Output from previous day: 08/09 0701 - 08/10 0700 In: 1723.8 [P.O.:360; I.V.:1363.8] Out: -  Intake/Output this shift: No intake/output data recorded.  General appearance: alert, cooperative and no distress Resp: clear to auscultation bilaterally GI: Soft, good bowel sounds, she did have some some nausea that is better. There is some early induration and erythema in the midline.  Lab Results:   Recent Labs  08/30/15 0449  WBC 7.0  HGB 9.1*  HCT 27.5*  PLT 220    BMET  Recent Labs  08/30/15 0449  NA 138  K 3.5  CL 105  CO2 28  GLUCOSE 104*  BUN 7  CREATININE 0.58  CALCIUM 8.0*   PT/INR No results for input(s): LABPROT, INR in the last 72 hours.   Recent Labs Lab 08/25/15 0050  AST 20  ALT 21  ALKPHOS 45  BILITOT 0.6  PROT 6.2*  ALBUMIN 3.5     Lipase     Component Value Date/Time   LIPASE 28 08/25/2015 0050     Studies/Results: No results found.  Medications: . acetaminophen  1,000 mg Oral Q6H  . alvimopan  12 mg Oral BID  . heparin subcutaneous  5,000 Units Subcutaneous Q8H   . dextrose 5 % and 0.9 % NaCl with KCl 20 mEq/L 50 mL/hr at 08/30/15 1313    Assessment/Plan Cecal  volvulus Emergent diagnostic laparoscopy, converted to exploratory laparotomy with extended right colectomy, 08/25/15, Dr. Jackolyn Confer POD 6 Pain control - confused this a.m. with Dilaudid/felt morphine wasn't effective. FEN: IV/fluids/soft diet ID: Preadmitted antibiotics only DVT: Heparin/SCD  Plan: Initially planned to send her home this a.m. I'm going to let her eat, saline lock her IV, clean the incision and see how she does today. I'm concerned about the midline incision now.      LOS: 6 days    JENNINGS,WILLARD 08/31/2015 [phone]  Agree with above. She still hasn't "turned the corner".  She has a superficial wound infection in the lower 1/2 of her wound.  I removed staples and started packing orders.  Husband in room.  Alphonsa Overall, MD, [facility] Surgery Pager: [phone] Office phone:  [phone]

## 2015-09-01 NOTE — Progress Notes (Signed)
7 Days Post-Op  Subjective: Patient's have a very hard time with the open wound. She says her husband can't do it and she doesn't have anyone at home that can do it. We will look into home health and have a case manager see her about this. She is starting to tolerate her diet and is having bowel movements. The open wound looks good and remaining staple line looks good.  Objective: Vital signs in last 24 hours: Temp:  [98.3 F (36.8 C)-99 F (37.2 C)] 98.4 F (36.9 C) (08/11 0506) Pulse Rate:  [74-83] 83 (08/11 0506) Resp:  [18] 18 (08/10 2211) BP: (120-155)/(67-76) 155/76 (08/11 0506) SpO2:  [100 %] 100 % (08/11 0506) Last BM Date: 09/01/15 780 by mouth Voided 4 BM 5 Afebrile vital signs are stable No labs Intake/Output from previous day: 08/10 0701 - 08/11 0700 In: 780 [P.O.:780] Out: -  Intake/Output this shift: Total I/O In: 240 [P.O.:240] Out: -   General appearance: alert, cooperative and no distress Resp: clear to auscultation bilaterally GI: Open site looks good, remaining staple line is intact. Positive bowel sounds and bowel movements. Tolerating soft.  Lab Results:   Recent Labs  08/30/15 0449  WBC 7.0  HGB 9.1*  HCT 27.5*  PLT 220    BMET  Recent Labs  08/30/15 0449  NA 138  K 3.5  CL 105  CO2 28  GLUCOSE 104*  BUN 7  CREATININE 0.58  CALCIUM 8.0*   PT/INR No results for input(s): LABPROT, INR in the last 72 hours.  No results for input(s): AST, ALT, ALKPHOS, BILITOT, PROT, ALBUMIN in the last 168 hours.   Lipase     Component Value Date/Time   LIPASE 28 08/25/2015 0050     Studies/Results: No results found.  Medications: . acetaminophen  1,000 mg Oral Q6H  . amitriptyline  10 mg Oral QHS  . heparin subcutaneous  5,000 Units Subcutaneous Q8H    Assessment/Plan  [name] Emergent diagnostic laparoscopy, converted to exploratory laparotomy with extended right colectomy, 08/25/15, Dr. Jackolyn Confer  POD 7 Pain control - confused this a.m. with Dilaudid/felt morphine wasn't effective. FEN: IV/fluids/soft diet ID: Preadmitted antibiotics only DVT: Heparin/SCD  Plan: We will set up home health to assist with wound care, and let her shower with wound open today and plan discharge home tomorrow. We've changed her follow-up to 09/08/15 which will be 14 days postop to see Dr. Zella Richer and she can get her remaining staples out at that time.     LOS: 7 days    JENNINGS,WILLARD 09/01/2015 [phone]  Agree with above.  Looks much better and bowels seem to be doing better.   Wound is improving.  To shower tonight. Follow up with wound will be with [facility] and family.  Son in room with wife and youngest granddaughter (31 yo).  Alphonsa Overall, MD, [facility] Surgery Pager: [phone] Office phone:  [phone]

## 2015-09-01 NOTE — Progress Notes (Signed)
Discharge planning, spoke with patient and family at beside. Chose [facility] for [facility] services, contacted [facility] for referral.  [phone]

## 2015-09-02 MED ORDER — OXYCODONE HCL 5 MG PO TABS: 5.0000 mg | ORAL_TABLET | ORAL | 0 refills | Status: DC | PRN

## 2015-09-02 MED ORDER — ALUM & MAG HYDROXIDE-SIMETH 200-200-20 MG/5ML PO SUSP: 30.0000 mL | Freq: Four times a day (QID) | ORAL | Status: DC | PRN

## 2015-09-02 NOTE — Progress Notes (Signed)
8 Days Post-Op  Subjective: Tolerating diet.  Feeling more comfortable about dressing changes.  Tolerating diet.  Bowels moving.  Objective: Vital signs in last 24 hours: Temp:  [98.2 F (36.8 C)-99.1 F (37.3 C)] 98.5 F (36.9 C) (08/12 0523) Pulse Rate:  [71-88] 82 (08/12 0523) Resp:  [16] 16 (08/12 0523) BP: (121-143)/(58-90) 142/69 (08/12 0523) SpO2:  [100 %] 100 % (08/12 0523) Last BM Date: 09/01/15  Intake/Output from previous day: 08/11 0701 - 08/12 0700 In: 480 [P.O.:480] Out: -  Intake/Output this shift: No intake/output data recorded.  PE: General- In NAD Abdomen-soft, open area of wound is clean, rest of wound clean and intact with staples   Lab Results:  No results for input(s): WBC, HGB, HCT, PLT in the last 72 hours. BMET No results for input(s): NA, K, CL, CO2, GLUCOSE, BUN, CREATININE, CALCIUM in the last 72 hours. PT/INR No results for input(s): LABPROT, INR in the last 72 hours. Comprehensive Metabolic Panel:    Component Value Date/Time   NA 138 08/30/2015 0449   NA 139 08/26/2015 0415   K 3.5 08/30/2015 0449   K 4.8 08/26/2015 0415   CL 105 08/30/2015 0449   CL 105 08/26/2015 0415   CO2 28 08/30/2015 0449   CO2 30 08/26/2015 0415   BUN 7 08/30/2015 0449   BUN 15 08/26/2015 0415   CREATININE 0.58 08/30/2015 0449   CREATININE 0.87 08/26/2015 0415   GLUCOSE 104 (H) 08/30/2015 0449   GLUCOSE 157 (H) 08/26/2015 0415   CALCIUM 8.0 (L) 08/30/2015 0449   CALCIUM 7.8 (L) 08/26/2015 0415   AST 20 08/25/2015 0050   ALT 21 08/25/2015 0050   ALKPHOS 45 08/25/2015 0050   BILITOT 0.6 08/25/2015 0050   PROT 6.2 (L) 08/25/2015 0050   ALBUMIN 3.5 08/25/2015 0050     Studies/Results: No results found.  Anti-infectives: Anti-infectives    Start     Dose/Rate Route Frequency Ordered Stop   08/25/15 2200  cefoTEtan (CEFOTAN) 2 g in dextrose 5 % 50 mL IVPB     2 g 100 mL/hr over 30 Minutes Intravenous Every 12 hours 08/25/15 1337 08/25/15 2225   08/25/15 1118  clindamycin (CLEOCIN) 900 mg, gentamicin (GARAMYCIN) 240 mg in sodium chloride 0.9 % 1,000 mL for intraperitoneal lavage  Status:  Discontinued       As needed 08/25/15 1118 08/25/15 1126   08/25/15 0830  clindamycin (CLEOCIN) 900 mg, gentamicin (GARAMYCIN) 240 mg in sodium chloride 0.9 % 1,000 mL for intraperitoneal lavage  Status:  Discontinued      Intraperitoneal To Surgery 08/25/15 0740 08/25/15 1337   08/25/15 0745  cefoTEtan (CEFOTAN) 2 g in dextrose 5 % 50 mL IVPB     2 g 100 mL/hr over 30 Minutes Intravenous On call to O.R. 08/25/15 0740 08/25/15 [id number]      Assessment [name]-  Emergent diagnostic laparoscopy, converted to exploratory laparotomy with extended right colectomy, 08/25/15, Dr. Jackolyn Confer -progressing well   Postop superficial wound infection-wound looks good; she and her husband understand that she was at higher risk due to emergency surgery and lack of bowel prep   LOS: 8 days   Plan: Discharge today with [facility] to help with wound care.  Follow up 09/08/15 in office.  Discharge instructions given to them.   Winna Golla J 09/02/2015

## 2015-09-02 NOTE — Progress Notes (Signed)
Patient discharged home, all discharge medications and instructions reviewed and questions answered. Patient to be assisted to vehicle by wheelchair.  

## 2015-09-02 NOTE — Discharge Instructions (Signed)
Removal of the Colon (Colectomy) Care After  HOME CARE INSTRUCTIONS    Change dressings as directed-saline damp to dry daily.  Only take over-the-counter or prescription medicines for pain, discomfort, or fever as directed by your caregiver.   Eat a bland, high protein lowfat diet.  Drink plenty of fluids.  Do not overeat.  Do not drive for at least 2-3 weeks and then only if you are no longer taking pain medicine.  There should be no heavy lifting (more than 10 pounds), strenuous activities or contact sports for at least 6 weeks, and then only after approval of your surgeon.   Keep the wound dry and clean.  You may shower before your dressing change. The wound may be washed gently with soap and water. Gently blot or dab dry following cleansing, without rubbing. Do not take baths, use swimming pools, or use hot tubs for 3 weeks, or as instructed by your caregivers.   Walk frequently.  Call the office [phone]) to confirm follow up appointment on 09/08/15. SEEK MEDICAL CARE IF (call the office):   There is redness, swelling, or increasing pain in the wound area.   Pus or blood is coming from the wound.   An unexplained oral temperature above 101.5 degrees F develops.   You notice a foul smell coming from the wound or dressing.   There is a breaking open of a wound (edges not staying together) after the sutures have been removed.   There is increasing abdominal pain.   You have persistent vomiting. SEEK IMMEDIATE MEDICAL CARE IF:   A rash develops.   There is difficulty breathing, or development of a reaction or side effects to medications given.  Document Released: 08/08/2003 Document Revised: 09/19/2010 Document Reviewed: 02/10/2007 [facility] Patient Information 2012 [place].

## 2015-09-02 NOTE — Care Management Note (Signed)
Case Management Note  Patient Details  Name: Madison Carr MRN: [id number] Date of Birth: 03-25-1950  Subjective/Objective:  emergent diagnostic Laparoscopy, converted to exploratory laparotomy with extended right colectomy 08/25/2015                               Action/Plan: Discharge Planning: AVS reviewed:   Please see previous NCM notes. NCM spoke to pt. [facility] scheduled to do dressing changes. Unit RN will provided pt with couple of days supplies. Will request with [facility] a start of care for 09/03/2015. Pt can do dressing changes, husband has attempted to learn but not successful.  [facility]P [facility] Phone: [phone]; Fax: [phone] Expected Discharge Date:  09/02/2015              Expected Discharge Plan:  [place]  In-House Referral:  NA  Discharge planning Services  CM Consult  Post Acute Care Choice:  Home Health Choice offered to:  Patient  DME Arranged:  N/A DME Agency:  NA  HH Arranged:  RN [place] Agency:  [place]  Status of Service:  Completed, signed off  If discussed at [place] of Stay Meetings, dates discussed:    Additional Comments:  Erenest Rasher, RN 09/02/2015, 10:52 AM

## 2015-10-08 ENCOUNTER — Other Ambulatory Visit: Payer: Self-pay | Admitting: Nurse Practitioner

## 2015-10-09 ENCOUNTER — Ambulatory Visit (INDEPENDENT_AMBULATORY_CARE_PROVIDER_SITE_OTHER): Payer: BLUE CROSS/BLUE SHIELD | Admitting: Nurse Practitioner

## 2015-10-09 ENCOUNTER — Other Ambulatory Visit: Payer: Self-pay | Admitting: Obstetrics & Gynecology

## 2015-10-09 ENCOUNTER — Encounter: Payer: Self-pay | Admitting: Nurse Practitioner

## 2015-10-09 VITALS — BP 96/62 | HR 72 | Ht 66.5 in | Wt 126.0 lb

## 2015-10-09 DIAGNOSIS — E2839 Other primary ovarian failure: Secondary | ICD-10-CM

## 2015-10-09 DIAGNOSIS — Z1231 Encounter for screening mammogram for malignant neoplasm of breast: Secondary | ICD-10-CM

## 2015-10-09 DIAGNOSIS — Z0000 Encounter for general adult medical examination without abnormal findings: Secondary | ICD-10-CM | POA: Diagnosis not present

## 2015-10-09 DIAGNOSIS — R8290 Unspecified abnormal findings in urine: Secondary | ICD-10-CM

## 2015-10-09 DIAGNOSIS — Z01419 Encounter for gynecological examination (general) (routine) without abnormal findings: Secondary | ICD-10-CM | POA: Diagnosis not present

## 2015-10-09 DIAGNOSIS — N952 Postmenopausal atrophic vaginitis: Secondary | ICD-10-CM | POA: Diagnosis not present

## 2015-10-09 LAB — POCT URINALYSIS DIPSTICK
BILIRUBIN UA: NEGATIVE
Glucose, UA: NEGATIVE
KETONES UA: NEGATIVE
NITRITE UA: NEGATIVE
PH UA: 6.5
PROTEIN UA: NEGATIVE
RBC UA: NEGATIVE
Urobilinogen, UA: NEGATIVE

## 2015-10-09 MED ORDER — ESTRADIOL 0.1 MG/GM VA CREA: TOPICAL_CREAM | VAGINAL | 3 refills | Status: DC

## 2015-10-09 MED ORDER — ESTRADIOL 10 MCG VA TABS: 1.0000 | ORAL_TABLET | VAGINAL | 4 refills | Status: DC

## 2015-10-09 MED ORDER — NITROFURANTOIN MONOHYD MACRO 100 MG PO CAPS: 100.0000 mg | ORAL_CAPSULE | Freq: Two times a day (BID) | ORAL | 0 refills | Status: DC

## 2015-10-09 MED ORDER — AMITRIPTYLINE HCL 10 MG PO TABS: 10.0000 mg | ORAL_TABLET | Freq: Every day | ORAL | 4 refills | Status: DC

## 2015-10-09 MED ORDER — ESTRADIOL-NORETHINDRONE ACET 0.5-0.1 MG PO TABS: 1.0000 | ORAL_TABLET | Freq: Every day | ORAL | 4 refills | Status: DC

## 2015-10-09 NOTE — Telephone Encounter (Signed)
Medication refill request: Amitriptyline Last AEX:  09/30/14 PG Next AEX: 10/09/15 PG Last MMG (if hormonal medication request): 11/07/14 [id number], Density B, [facility] Refill authorized: 09/30/14 #90 3R. Please advise. Thank you.

## 2015-10-09 NOTE — Patient Instructions (Signed)

## 2015-10-09 NOTE — Progress Notes (Signed)
Patient ID: Madison Carr, female   DOB: [birth year], 65 y.o.   MRN: [id number]  64 y.o. [id number] Married  Caucasian Fe here for annual exam.  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  She has had abdominal distention and pain as her main symptoms.  She then had infection at the suture area and staples were removed.  Still some oozing from the umbilical site and wears a dressing.  She dies have some urinary symptoms of urgency and frequency and is concerned that she may have a UTI since she had a foley catheter.  Patient's last menstrual period was 11/22/2003 (approximate).          Sexually active: Yes.    The current method of family planning is post menopausal status.    Exercising: Yes.    Gym/ health club routine includes working with personal trainer 3 times per week, tennis, golfing and walking. Smoker:  no  Health Maintenance: Pap: 09/30/14, Negative with neg HR HPV(remote history of abnormal 2010 with colpo biopsy) MMG:11/07/14, 3D, Bi-Rads 1: Negative, scheduled for 11/13/15 Colonoscopy: 2015, polyps, repeat in 3 years BMD: 10/26/13, T-Score -0.2 Spine / -1.2 Left Femur Total (no score for neck) TDaP:  06/2014 Shingles: done PCP Pneumonia: Not indicated due to age Hep C and HIV: done today Labs: 08/2015 hospital encounter in [facility], otherwise PCP Urine: 2+ Leuk's, burning with urination   reports that she has never smoked. She has never used smokeless tobacco. She reports that she drinks about 2.5 oz of alcohol per week . She reports that she does not use drugs.  Past Medical History:  Diagnosis Date  . Basal cell carcinoma    nose  . Fibromyalgia   . Insomnia   . Postmenopausal atrophic vaginitis 5/09    Past Surgical History:  Procedure Laterality Date  . BASAL CELL CARCINOMA EXCISION  2008 ?   nose  . COLPOSCOPY W/ BIOPSY / CURETTAGE  07/11/2008   benign with focal koilocytic atypia, followup remained ASCUS with neg. HR HPV through 2013  . ENDOMETRIAL ABLATION  2005   . LAPAROSCOPIC RIGHT COLECTOMY N/A 08/25/2015   Procedure: Diagnostic LAPAROSCOPY;  Surgeon: Jackolyn Confer, MD;  Location: [facility];  Service: General;  Laterality: N/A;  . PARTIAL COLECTOMY  08/25/2015   Procedure: Extended right COLECTOMY;  Surgeon: Jackolyn Confer, MD;  Location: [facility];  Service: General;;  . TONSILLECTOMY AND ADENOIDECTOMY  1968    Current Outpatient Prescriptions  Medication Sig Dispense Refill  . amitriptyline (ELAVIL) 10 MG tablet Take 1 tablet (10 mg total) by mouth at bedtime. 90 tablet 3  . CALCIUM PO Take by mouth daily.    . cholecalciferol (VITAMIN D) 1000 UNITS tablet Take 1,000 Units by mouth daily.    . Cyanocobalamin (VITAMIN B-12 PO) Take 1,000 mg by mouth daily. Vitamin B12    . estradiol (ESTRACE VAGINAL) 0.1 MG/GM vaginal cream APPLY to urethra twice day. 42.5 g 3  . Estradiol (VAGIFEM) 10 MCG TABS vaginal tablet Place 1 tablet (10 mcg total) vaginally 2 (two) times a week. 24 tablet 4  . LORazepam (ATIVAN) 1 MG tablet Take 1 mg by mouth at bedtime as needed for sleep.     Marland Kitchen MIMVEY LO 0.5-0.1 MG tablet TAKE 1/2 TABLET DAILY 28 tablet 3  . Multiple Vitamins-Minerals (OCUVITE ADULT 50+) CAPS Take 1 capsule by mouth daily.    Marland Kitchen oxyCODONE (OXY IR/ROXICODONE) 5 MG immediate release tablet Take 1-2 tablets (5-10 mg total) by mouth every  4 (four) hours as needed for moderate pain or severe pain (TRY TO GET HER ON po PAIN MEDS SO WE CAN GET RID OF PCA). 40 tablet 0   No current facility-administered medications for this visit.     Family History  Problem Relation Age of Onset  . Melanoma Mother 25    left leg  . Heart disease Father     macular deg.  . Macular degeneration Father   . Macular degeneration Paternal Aunt   . Macular degeneration Paternal Uncle     ROS:  Pertinent items are noted in HPI.  Otherwise, a comprehensive ROS was negative.  Exam:   LMP 11/22/2003 (Approximate)    Ht Readings from Last 3 Encounters:  08/25/15 5\' 7"  (1.702 m)   09/30/14 5' 6.5" (1.689 m)  10/15/13 5\' 7"  (1.702 m)    General appearance: alert, cooperative and appears stated age Head: Normocephalic, without obvious abnormality, atraumatic Neck: no adenopathy, supple, symmetrical, trachea midline and thyroid normal to inspection and palpation Lungs: clear to auscultation bilaterally Breasts: normal appearance, no masses or tenderness Heart: regular rate and rhythm Abdomen: soft, non-tender; no masses,  no organomegaly.  Somewhat tender with dressing at the umbilicus.  Very little discharge and no redness. Extremities: extremities normal, atraumatic, no cyanosis or edema Skin: Skin color, texture, turgor normal. No rashes or lesions Lymph nodes: Cervical, supraclavicular, and axillary nodes normal. No abnormal inguinal nodes palpated Neurologic: Grossly normal   Pelvic: External genitalia:  no lesions              Urethra:  normal appearing urethra with no masses, tenderness or lesions              Bartholin's and Skene's: normal                 Vagina: normal appearing vagina with normal color and discharge, no lesions except for left vaginal wall cyst that is the same size as last year.              Cervix: anteverted              Pap taken: No. Bimanual Exam:  Uterus:  normal size, contour, position, consistency, mobility, non-tender              Adnexa: no mass, fullness, tenderness               Rectovaginal: not done               Anus:  Not done per request  Chaperone present: yes  A:  Well Woman with normal exam  Postmenopausal on HRT - tapered to 1/2 tablet  Atrophic vaginitis on Vagifem  History of ASCUS with negative HR HPV 06/2008 - normal since.  History of fibromyalgia, insomnia, and vit D deficiency.  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 08/25/15 R/O UTI              Left vaginal wall pain secondary to vaginal wall cyst           P:   Reviewed health and wellness pertinent to exam  Pap smear as above  Mammogram is due 10/17 and is scheduled - will also add BMD  Refill of Vagifem and Estrace to the urethra twice a week  Refill on Menest for a year  Counseled with risk of CVA, DVT, cancer, etc  Refill on Amitriptyline for a year  New RX for Macrobid # 14 and await urine C&S and micro  Will follow with labs  Counseled on breast self exam, mammography screening, use and side effects of HRT, adequate intake of calcium and vitamin D, diet and exercise, Kegel's exercises return annually or prn  An After Visit Summary was printed and given to the patient.

## 2015-10-09 NOTE — Telephone Encounter (Signed)
Medication refill request: Amitriptyline 10mg  Last AEX:  09/30/14 PG Next AEX: 10/09/15 (today) Last MMG (if hormonal medication request): 11/07/14 BIRADS1 negative Refill authorized: 09/30/14 #90 w/3refills; today patient has appointment. Will refill at appt.

## 2015-10-09 NOTE — Telephone Encounter (Signed)
She is coming in today and will refill then

## 2015-10-10 LAB — HIV ANTIBODY (ROUTINE TESTING W REFLEX): HIV: NONREACTIVE

## 2015-10-10 LAB — URINALYSIS, MICROSCOPIC ONLY
CASTS: NONE SEEN [LPF]
CRYSTALS: NONE SEEN [HPF]
Yeast: NONE SEEN [HPF]

## 2015-10-10 LAB — URINE CULTURE

## 2015-10-10 LAB — HEPATITIS C ANTIBODY: HCV AB: NEGATIVE

## 2015-10-10 LAB — VITAMIN D 25 HYDROXY (VIT D DEFICIENCY, FRACTURES): VIT D 25 HYDROXY: 37 ng/mL (ref 30–100)

## 2015-10-13 NOTE — Progress Notes (Signed)
Encounter reviewed by Dr. Aundria Rud. Surgery for cecal volvulus performed 08/25/15.

## 2015-11-13 ENCOUNTER — Ambulatory Visit
Admission: RE | Admit: 2015-11-13 | Discharge: 2015-11-13 | Disposition: A | Discharge status: Home / Self Care | Payer: BLUE CROSS/BLUE SHIELD | Source: Ambulatory Visit | Attending: Nurse Practitioner | Admitting: Nurse Practitioner

## 2015-11-13 ENCOUNTER — Ambulatory Visit: Payer: BLUE CROSS/BLUE SHIELD

## 2015-11-13 ENCOUNTER — Ambulatory Visit
Admission: RE | Admit: 2015-11-13 | Discharge: 2015-11-13 | Disposition: A | Discharge status: Home / Self Care | Payer: BLUE CROSS/BLUE SHIELD | Source: Ambulatory Visit | Attending: Obstetrics & Gynecology | Admitting: Obstetrics & Gynecology

## 2015-11-13 DIAGNOSIS — E2839 Other primary ovarian failure: Secondary | ICD-10-CM

## 2015-11-13 DIAGNOSIS — Z1231 Encounter for screening mammogram for malignant neoplasm of breast: Secondary | ICD-10-CM

## 2015-12-02 ENCOUNTER — Other Ambulatory Visit: Payer: Self-pay | Admitting: Nurse Practitioner

## 2015-12-04 NOTE — Telephone Encounter (Signed)
eScribe request from [facility] for refill on [facility] Last filled - 10/09/15, #24 X 4 REFILLS Last AEX - 10/09/15 Next AEX - not scheduled Last MMG - 11/13/15, Bi-Rads 1:  Negative, repeat in one year  RX declined as new RX was sent on 10/09/15.

## 2015-12-11 ENCOUNTER — Other Ambulatory Visit: Payer: Self-pay | Admitting: Nurse Practitioner

## 2015-12-13 ENCOUNTER — Other Ambulatory Visit: Payer: Self-pay | Admitting: Nurse Practitioner

## 2015-12-18 NOTE — Telephone Encounter (Signed)
Medication refill request: YUVAFEM 10MCG Last AEX:  10/09/15 PG Next AEX: none scheduled Last MMG (if hormonal medication request): 11/13/15, Bi-Rads 1:  Negative, repeat in one year Refill authorized: 10/09/15, #24 X 4 REFILLS

## 2016-10-14 ENCOUNTER — Encounter: Payer: Self-pay | Admitting: Obstetrics and Gynecology

## 2016-10-14 ENCOUNTER — Ambulatory Visit (INDEPENDENT_AMBULATORY_CARE_PROVIDER_SITE_OTHER): Payer: Self-pay | Admitting: Obstetrics and Gynecology

## 2016-10-14 VITALS — BP 116/72 | HR 72 | Resp 12 | Ht 66.5 in | Wt 133.0 lb

## 2016-10-14 DIAGNOSIS — R300 Dysuria: Secondary | ICD-10-CM

## 2016-10-14 DIAGNOSIS — Z0000 Encounter for general adult medical examination without abnormal findings: Secondary | ICD-10-CM

## 2016-10-14 DIAGNOSIS — Z79890 Hormone replacement therapy (postmenopausal): Secondary | ICD-10-CM

## 2016-10-14 DIAGNOSIS — Z01419 Encounter for gynecological examination (general) (routine) without abnormal findings: Secondary | ICD-10-CM

## 2016-10-14 DIAGNOSIS — N952 Postmenopausal atrophic vaginitis: Secondary | ICD-10-CM

## 2016-10-14 LAB — POCT URINALYSIS DIPSTICK
BILIRUBIN UA: NEGATIVE
GLUCOSE UA: NEGATIVE
Ketones, UA: NEGATIVE
NITRITE UA: NEGATIVE
Protein, UA: NEGATIVE
RBC UA: NEGATIVE
Urobilinogen, UA: NEGATIVE E.U./dL — AB
pH, UA: 7 (ref 5.0–8.0)

## 2016-10-14 MED ORDER — ESTRADIOL 10 MCG VA TABS: 1.0000 | ORAL_TABLET | VAGINAL | 3 refills | Status: DC

## 2016-10-14 MED ORDER — PHENAZOPYRIDINE HCL 200 MG PO TABS: 200.0000 mg | ORAL_TABLET | Freq: Three times a day (TID) | ORAL | 0 refills | Status: DC | PRN

## 2016-10-14 MED ORDER — SULFAMETHOXAZOLE-TRIMETHOPRIM 800-160 MG PO TABS: 1.0000 | ORAL_TABLET | Freq: Two times a day (BID) | ORAL | 0 refills | Status: DC

## 2016-10-14 NOTE — Progress Notes (Signed)
66 y.o. [id number] MarriedCaucasianF here for annual exam. She is on 1/2 a tablet of HRT (1/2 of the 0.5 estradiol). She isn't having any hotflashes or night sweats. She has significant vaginal atrophy.  She is on vagifem for atrophy and uses a small amount of estrace cream at the urethral opening 2 x a week secondary to a h/o a prolapsed urethra.    She has a h/o a cecal volvulus, had a partial colectomy in 8/17. Still some residual pain. Bowel movements are normal (used to be constipated, not currently).  She has had a chronic cough. Will f/u with her primary. Was thought it might be from GERD, not helped with Prilosec.  Not coughing anything up.  Internally she feels some irritation on the left side of her vagina with intercourse and recently with urination. Slight dysuria in the last few days. Feels internal. Slight increase in urinary frequency and urgency, voiding normal amounts.     Patient's last menstrual period was 11/22/2003 (approximate).          Sexually active: Yes.    The current method of family planning is post menopausal status.    Exercising: Yes.    trainer/ gym/ tennis/walking Smoker:  no  Health Maintenance: Pap:  09-30-14 [facility] NEG HR HPV 10-15-13 WNL NEG HR HPV History of abnormal Pap:  Yes- colposcopy X 2 MMG:  11-13-15 WNL Colonoscopy:  2015 polyp   BMD:   11-13-15 osteopenia, -1.7 Femur TDaP:  06-09-14  Gardasil: N/A   reports that she has never smoked. She has never used smokeless tobacco. She reports that she drinks about 2.5 oz of alcohol per week . She reports that she does not use drugs.  Daughter is a Multimedia programmer, has 2 kids. Son has one kid, one on the way. Both are local.   Past Medical History:  Diagnosis Date  . Basal cell carcinoma    nose  . Fibromyalgia   . Insomnia   . Postmenopausal atrophic vaginitis 5/09  She doesn't think she has fibromyalgia, feeling okay.   Past Surgical History:  Procedure Laterality Date  . BASAL CELL CARCINOMA EXCISION  2008 ?    nose  . COLPOSCOPY    . COLPOSCOPY W/ BIOPSY / CURETTAGE  07/11/2008   benign with focal koilocytic atypia, followup remained ASCUS with neg. HR HPV through 2013  . ENDOMETRIAL ABLATION  2005  . LAPAROSCOPIC RIGHT COLECTOMY N/A 08/25/2015   Procedure: Diagnostic LAPAROSCOPY;  Surgeon: Jackolyn Confer, MD;  Location: [facility];  Service: General;  Laterality: N/A;  . PARTIAL COLECTOMY  08/25/2015   Procedure: Extended right COLECTOMY;  Surgeon: Jackolyn Confer, MD;  Location: [facility];  Service: General;;  . TONSILLECTOMY AND ADENOIDECTOMY  1968    Current Outpatient Prescriptions  Medication Sig Dispense Refill  . amitriptyline (ELAVIL) 10 MG tablet Take 1 tablet (10 mg total) by mouth at bedtime. 90 tablet 4  . CALCIUM PO Take by mouth daily.    . cholecalciferol (VITAMIN D) 1000 UNITS tablet Take 1,000 Units by mouth daily.    . Cyanocobalamin (VITAMIN B-12 PO) Take 1,000 mg by mouth daily. Vitamin B12    . estradiol (ESTRACE VAGINAL) 0.1 MG/GM vaginal cream APPLY to urethra twice day. 42.5 g 3  . Estradiol (VAGIFEM) 10 MCG TABS vaginal tablet Place 1 tablet (10 mcg total) vaginally 2 (two) times a week. 24 tablet 4  . Estradiol-Norethindrone Acet (MIMVEY LO) 0.5-0.1 MG tablet Take 1 tablet by mouth daily. 90 tablet 4  .  LORazepam (ATIVAN) 1 MG tablet Take 1 mg by mouth at bedtime as needed for sleep.     . Multiple Vitamins-Minerals (OCUVITE ADULT 50+) CAPS Take 1 capsule by mouth daily.     No current facility-administered medications for this visit.     Family History  Problem Relation Age of Onset  . Melanoma Mother 25       left leg  . Heart disease Father        macular deg.  . Macular degeneration Father   . Macular degeneration Paternal Aunt   . Macular degeneration Paternal Uncle     Review of Systems  Constitutional: Negative.   HENT: Negative.   Eyes: Negative.   Respiratory: Negative.   Cardiovascular: Negative.   Gastrointestinal: Negative.   Endocrine: Negative.    Genitourinary: Negative.        Vaginal discomfort inside left vaginal wall   Musculoskeletal: Negative.   Skin: Negative.   Allergic/Immunologic: Negative.   Neurological: Negative.   Psychiatric/Behavioral: Negative.     Exam:   BP 116/72 (BP Location: Right Arm, Patient Position: Sitting, Cuff Size: Normal)   Pulse 72   Resp 12   Ht 5' 6.5" (1.689 m)   Wt 133 lb (60.3 kg)   LMP 11/22/2003 (Approximate)   BMI 21.15 kg/m   Weight change: @WEIGHTCHANGE @ Height:   Height: 5' 6.5" (168.9 cm)  Ht Readings from Last 3 Encounters:  10/14/16 5' 6.5" (1.689 m)  10/09/15 5' 6.5" (1.689 m)  08/25/15 5\' 7"  (1.702 m)    General appearance: alert, cooperative and appears stated age Head: Normocephalic, without obvious abnormality, atraumatic Neck: no adenopathy, supple, symmetrical, trachea midline and thyroid normal to inspection and palpation Lungs: clear to auscultation bilaterally Cardiovascular: regular rate and rhythm Breasts: normal appearance, no masses or tenderness Abdomen: soft, non-tender; non distended,  no masses,  no organomegaly Extremities: extremities normal, atraumatic, no cyanosis or edema Skin: Skin color, texture, turgor normal. No rashes or lesions Lymph nodes: Cervical, supraclavicular, and axillary nodes normal. No abnormal inguinal nodes palpated Neurologic: Grossly normal   Pelvic: External genitalia:  no lesions              Urethra:  normal appearing urethra with no masses, tenderness or lesions              Bartholins and Skenes: normal                 Vagina: normal appearing vagina with normal color and discharge, no lesions  No abnormalities noted, tender with palpation of the left vaginal side wall.               Cervix: no lesions               Bimanual Exam:  Uterus:  normal size, contour, position, consistency, mobility, non-tender              Adnexa: no mass, fullness, tenderness               Rectovaginal: Confirms               Anus:   normal sphincter tone, no lesions  Chaperone was present for exam.  A:  Well Woman with normal exam  Symptoms of a UTI  Vaginal atrophy  HRT  She c/o a chronic cough and a new spot of irritation on her tonge, will f/u with primary  P:   Pap next year  Screening labs  Needs new  primary  Continue vagifem  Send urine for ua, c&s  Bactrim ds  Pyridium   DEXA next 1-2 years (particularly if off of HRT)  Will try to stop HRT, if doesn't tolerate, will reorder

## 2016-10-14 NOTE — Patient Instructions (Signed)

## 2016-10-15 LAB — CBC
Hematocrit: 41.5 % (ref 34.0–46.6)
Hemoglobin: 14.3 g/dL (ref 11.1–15.9)
MCH: 33.5 pg — AB (ref 26.6–33.0)
MCHC: 34.5 g/dL (ref 31.5–35.7)
MCV: 97 fL (ref 79–97)
PLATELETS: 229 10*3/uL (ref 150–379)
RBC: 4.27 x10E6/uL (ref 3.77–5.28)
RDW: 12.9 % (ref 12.3–15.4)
WBC: 6.4 10*3/uL (ref 3.4–10.8)

## 2016-10-15 LAB — COMPREHENSIVE METABOLIC PANEL
A/G RATIO: 1.6 (ref 1.2–2.2)
ALK PHOS: 52 IU/L (ref 39–117)
ALT: 11 IU/L (ref 0–32)
AST: 23 IU/L (ref 0–40)
Albumin: 4.2 g/dL (ref 3.6–4.8)
BILIRUBIN TOTAL: 0.6 mg/dL (ref 0.0–1.2)
BUN/Creatinine Ratio: 20 (ref 12–28)
BUN: 16 mg/dL (ref 8–27)
CHLORIDE: 100 mmol/L (ref 96–106)
CO2: 27 mmol/L (ref 20–29)
Calcium: 9.3 mg/dL (ref 8.7–10.3)
Creatinine, Ser: 0.82 mg/dL (ref 0.57–1.00)
GFR calc non Af Amer: 76 mL/min/{1.73_m2} (ref >59)
GFR, EST AFRICAN AMERICAN: 88 mL/min/{1.73_m2} (ref >59)
GLUCOSE: 89 mg/dL (ref 65–99)
Globulin, Total: 2.6 g/dL (ref 1.5–4.5)
Potassium: 4.4 mmol/L (ref 3.5–5.2)
Sodium: 140 mmol/L (ref 134–144)
Total Protein: 6.8 g/dL (ref 6.0–8.5)

## 2016-10-15 LAB — LIPID PANEL
Chol/HDL Ratio: 1.8 ratio (ref 0.0–4.4)
Cholesterol, Total: 184 mg/dL (ref 100–199)
HDL: 103 mg/dL (ref >39)
LDL Calculated: 68 mg/dL (ref 0–99)
TRIGLYCERIDES: 66 mg/dL (ref 0–149)
VLDL Cholesterol Cal: 13 mg/dL (ref 5–40)

## 2016-10-15 LAB — URINALYSIS, MICROSCOPIC ONLY: CASTS: NONE SEEN /LPF

## 2016-10-15 LAB — URINE CULTURE: ORGANISM ID, BACTERIA: NO GROWTH

## 2016-11-26 ENCOUNTER — Other Ambulatory Visit: Payer: Self-pay | Admitting: Obstetrics & Gynecology

## 2016-11-26 DIAGNOSIS — Z1231 Encounter for screening mammogram for malignant neoplasm of breast: Secondary | ICD-10-CM

## 2016-11-30 DIAGNOSIS — F5101 Primary insomnia: Secondary | ICD-10-CM | POA: Insufficient documentation

## 2016-12-24 ENCOUNTER — Ambulatory Visit
Admission: RE | Admit: 2016-12-24 | Discharge: 2016-12-24 | Disposition: A | Discharge status: Home / Self Care | Payer: No Typology Code available for payment source | Source: Ambulatory Visit | Attending: Obstetrics & Gynecology | Admitting: Obstetrics & Gynecology

## 2016-12-24 DIAGNOSIS — Z1231 Encounter for screening mammogram for malignant neoplasm of breast: Secondary | ICD-10-CM

## 2016-12-26 ENCOUNTER — Other Ambulatory Visit: Payer: Self-pay

## 2016-12-26 NOTE — Telephone Encounter (Signed)
Can you please find out why the patient is on the Elavil?

## 2016-12-26 NOTE — Telephone Encounter (Signed)
Medication refill request: Amitriptyline Last AEX:  10/14/16 JJ Next AEX: none scheduled  Last MMG (if hormonal medication request): 12/24/16 BIRADS 1 negative/density b Refill authorized: 10/09/15 #90 w/4 refills; today please advise

## 2016-12-27 NOTE — Telephone Encounter (Signed)
Spoke with patient. Patient taking Elavil for sleep. States she discussed this with PCP at last visit, thought this would be filled by PCP. Last refill was 11/2016 for 90 day suppy by Dr. Lonia Skinner. Patient unaware refill request to Dr. Talbert Nan. Patient will f/u with PCP for refill request.  Advised patient will update Dr. Talbert Nan and return call with any additional recommendations.   Routing to provider for final review. Patient is agreeable to disposition.

## 2017-11-17 ENCOUNTER — Other Ambulatory Visit: Payer: Self-pay | Admitting: Obstetrics and Gynecology

## 2017-11-17 NOTE — Telephone Encounter (Signed)
Medication refill request:Yuvafem #8,0R Last AEX:  10-14-16 Next AEX: 12-10-17 Last MMG (if hormonal medication request): 12-24-16 Neg/BiRads1 Refill authorized: Please advise

## 2017-11-18 ENCOUNTER — Other Ambulatory Visit: Payer: Self-pay | Admitting: Obstetrics & Gynecology

## 2017-11-18 DIAGNOSIS — Z1231 Encounter for screening mammogram for malignant neoplasm of breast: Secondary | ICD-10-CM

## 2017-12-01 ENCOUNTER — Other Ambulatory Visit: Payer: Self-pay | Admitting: Ophthalmology

## 2017-12-01 DIAGNOSIS — L720 Epidermal cyst: Secondary | ICD-10-CM | POA: Diagnosis not present

## 2017-12-01 DIAGNOSIS — D2310 Other benign neoplasm of skin of unspecified eyelid, including canthus: Secondary | ICD-10-CM | POA: Diagnosis not present

## 2017-12-01 DIAGNOSIS — D23121 Other benign neoplasm of skin of left upper eyelid, including canthus: Secondary | ICD-10-CM | POA: Diagnosis not present

## 2017-12-01 DIAGNOSIS — D23122 Other benign neoplasm of skin of left lower eyelid, including canthus: Secondary | ICD-10-CM | POA: Diagnosis not present

## 2017-12-01 DIAGNOSIS — L929 Granulomatous disorder of the skin and subcutaneous tissue, unspecified: Secondary | ICD-10-CM | POA: Diagnosis not present

## 2017-12-10 ENCOUNTER — Ambulatory Visit: Payer: Self-pay | Admitting: Obstetrics and Gynecology

## 2017-12-14 ENCOUNTER — Other Ambulatory Visit: Payer: Self-pay | Admitting: Obstetrics and Gynecology

## 2017-12-15 NOTE — Telephone Encounter (Signed)
Medication refill request: Yuvafem 21mcg Last AEX:  10-14-16 with dr Talbert Nan Next AEX: 01-12-18 Last MMG (if hormonal medication request): 12-24-16 category b density birads 1:neg Refill authorized: patient needs a refill to last her until her aex. Please approve if appropriate

## 2017-12-16 DIAGNOSIS — R69 Illness, unspecified: Secondary | ICD-10-CM | POA: Diagnosis not present

## 2017-12-29 ENCOUNTER — Ambulatory Visit
Admission: RE | Admit: 2017-12-29 | Discharge: 2017-12-29 | Disposition: A | Discharge status: Home / Self Care | Payer: Medicare HMO | Source: Ambulatory Visit | Attending: Obstetrics & Gynecology | Admitting: Obstetrics & Gynecology

## 2017-12-29 DIAGNOSIS — Z1231 Encounter for screening mammogram for malignant neoplasm of breast: Secondary | ICD-10-CM | POA: Diagnosis not present

## 2018-01-07 ENCOUNTER — Ambulatory Visit: Payer: Self-pay | Admitting: Obstetrics and Gynecology

## 2018-01-07 ENCOUNTER — Other Ambulatory Visit: Payer: Self-pay | Admitting: Obstetrics and Gynecology

## 2018-01-07 DIAGNOSIS — M25561 Pain in right knee: Secondary | ICD-10-CM | POA: Diagnosis not present

## 2018-01-07 DIAGNOSIS — M1711 Unilateral primary osteoarthritis, right knee: Secondary | ICD-10-CM | POA: Diagnosis not present

## 2018-01-08 NOTE — Progress Notes (Signed)
67 y.o. [id number] Married White or Caucasian Not Hispanic or Latino female here for annual exam.   She has gone off of systemic HRT. Just using vaginal estrogen. She notices a drop of urine leaking after she voids, occasionally leaks a minimal amount of urine with exercise. Since her colectomy she has had issues with loose stools to diarrhea. Hemorrhoids are worse. Sexually active, slight discomfort more on the left side of her vagina. She uses a lubricant.     Patient's last menstrual period was 11/22/2003 (approximate).          Sexually active: Yes.    The current method of family planning is post menopausal status.    Exercising: Yes.    gym, tennis, cardio at gym Smoker:  no  Health Maintenance: Pap:  09-30-14 [facility] NEG HR HPV 10-15-13 WNL NEG HR HPV History of abnormal Pap:  Yes- colposcopy X 2 MMG:  12/29/2017 Birads 1 negative Colonoscopy:  2015 polyp   BMD:   11-13-15 osteopenia, -1.7 Femur TDaP:  06-09-14  Gardasil: N/A   reports that she has never smoked. She has never used smokeless tobacco. She reports current alcohol use of about 5.0 standard drinks of alcohol per week. She reports that she does not use drugs.  She has 2 kids (son and daughter), 4 grandchildren and another one on the way (all girls)  Past Medical History:  Diagnosis Date  . Basal cell carcinoma    nose  . Fibromyalgia   . Insomnia   . Postmenopausal atrophic vaginitis 5/09    Past Surgical History:  Procedure Laterality Date  . BASAL CELL CARCINOMA EXCISION  2008 ?   nose  . COLPOSCOPY    . COLPOSCOPY W/ BIOPSY / CURETTAGE  07/11/2008   benign with focal koilocytic atypia, followup remained ASCUS with neg. HR HPV through 2013  . ENDOMETRIAL ABLATION  2005  . LAPAROSCOPIC RIGHT COLECTOMY N/A 08/25/2015   Procedure: Diagnostic LAPAROSCOPY;  Surgeon: Jackolyn Confer, MD;  Location: [facility];  Service: General;  Laterality: N/A;  . PARTIAL COLECTOMY  08/25/2015   Procedure: Extended right COLECTOMY;  Surgeon:  Jackolyn Confer, MD;  Location: [facility];  Service: General;;  . TONSILLECTOMY AND ADENOIDECTOMY  1968    Current Outpatient Medications  Medication Sig Dispense Refill  . amitriptyline (ELAVIL) 10 MG tablet Take 1 tablet (10 mg total) by mouth at bedtime. 90 tablet 4  . Ascorbic Acid (VITAMIN C) 500 MG CAPS Take 1 capsule by mouth daily.    Marland Kitchen CALCIUM-MAGNESIUM-ZINC PO Take by mouth. Calcium 1000mg  Magnesium 400mg   Zinc 25mg     . cholecalciferol (VITAMIN D) 1000 UNITS tablet Take 1,000 Units by mouth daily.    . Cyanocobalamin (VITAMIN B-12 PO) Take 1,000 mg by mouth daily. Vitamin B12    . diclofenac sodium (VOLTAREN) 1 % GEL diclofenac 1 % topical gel  APPLY 4 GRAMS TO THE AFFECTED AREA(S) BY TOPICAL ROUTE 4 TIMES PER DAY    . LORazepam (ATIVAN) 1 MG tablet Take 1 mg by mouth at bedtime as needed for sleep.     . Multiple Vitamins-Minerals (OCUVITE ADULT 50+) CAPS Take 1 capsule by mouth daily.    Merril Abbe 10 MCG TABS vaginal tablet PLACE 1 TABLET (10 MCG TOTAL) VAGINALLY 2 (TWO) TIMES A WEEK. 8 tablet 0   No current facility-administered medications for this visit.     Family History  Problem Relation Age of Onset  . Melanoma Mother 15       left  leg  . Heart disease Father        macular deg.  . Macular degeneration Father   . Macular degeneration Paternal Aunt   . Macular degeneration Paternal Uncle   . Breast cancer Neg Hx     Review of Systems  Constitutional: Negative.   HENT: Negative.   Eyes: Negative.   Respiratory: Negative.   Cardiovascular: Negative.   Gastrointestinal: Negative.   Endocrine: Negative.   Genitourinary:       Loss of urine spontaneously  Musculoskeletal:       Right knee pain  Skin: Negative.   Allergic/Immunologic: Negative.   Neurological: Negative.   Hematological: Negative.   Psychiatric/Behavioral: Negative.     Exam:   BP 122/70 (BP Location: Right Arm, Patient Position: Sitting, Cuff Size: Normal)   Pulse 84   Resp 16   Ht  5' 6.5" (1.689 m)   Wt 132 lb (59.9 kg)   LMP 11/22/2003 (Approximate)   BMI 20.99 kg/m   Weight change: @WEIGHTCHANGE @ Height:   Height: 5' 6.5" (168.9 cm)  Ht Readings from Last 3 Encounters:  01/12/18 5' 6.5" (1.689 m)  10/14/16 5' 6.5" (1.689 m)  10/09/15 5' 6.5" (1.689 m)    General appearance: alert, cooperative and appears stated age Head: Normocephalic, without obvious abnormality, atraumatic Neck: no adenopathy, supple, symmetrical, trachea midline and thyroid normal to inspection and palpation Lungs: clear to auscultation bilaterally Cardiovascular: regular rate and rhythm Breasts: normal appearance, no masses or tenderness Abdomen: soft, non-tender; non distended,  no masses,  no organomegaly Extremities: extremities normal, atraumatic, no cyanosis or edema Skin: Skin color, texture, turgor normal. No rashes or lesions Lymph nodes: Cervical, supraclavicular, and axillary nodes normal. No abnormal inguinal nodes palpated Neurologic: Grossly normal   Pelvic: External genitalia:  no lesions              Urethra:  normal appearing urethra with no masses, tenderness or lesions              Bartholins and Skenes: normal                 Vagina: atrophic appearing vagina with normal color and discharge, no lesions              Cervix: no lesions               Bimanual Exam:  Uterus:  normal size, contour, position, consistency, mobility, non-tender              Adnexa: no mass, fullness, tenderness               Rectovaginal: Confirms               Anus:  normal sphincter tone, no lesions  Chaperone was present for exam.  A:  Well Woman with normal exam  Vaginal atrophy, helped with vaginal estrogen  She takes Elavil for sleep (I will refill for her now, in the future will fill with her primary)  Osteopenia, stopped HRT last year    P:   Pap with HPV  Continue with vaginal estrogen  Mammogram just done  Colonoscopy due, she will schedule  Discussed breast self  exam  Discussed calcium and vit D intake   Fasting labs today  DEXA ordered

## 2018-01-12 ENCOUNTER — Other Ambulatory Visit (HOSPITAL_COMMUNITY)
Admission: RE | Admit: 2018-01-12 | Discharge: 2018-01-12 | Disposition: A | Discharge status: Home / Self Care | Payer: Medicare HMO | Source: Ambulatory Visit | Attending: Obstetrics and Gynecology | Admitting: Obstetrics and Gynecology

## 2018-01-12 ENCOUNTER — Encounter: Payer: Self-pay | Admitting: Obstetrics and Gynecology

## 2018-01-12 ENCOUNTER — Other Ambulatory Visit: Payer: Self-pay

## 2018-01-12 ENCOUNTER — Ambulatory Visit (INDEPENDENT_AMBULATORY_CARE_PROVIDER_SITE_OTHER): Payer: Medicare HMO | Admitting: Obstetrics and Gynecology

## 2018-01-12 VITALS — BP 122/70 | HR 84 | Resp 16 | Ht 66.5 in | Wt 132.0 lb

## 2018-01-12 DIAGNOSIS — E2839 Other primary ovarian failure: Secondary | ICD-10-CM

## 2018-01-12 DIAGNOSIS — N952 Postmenopausal atrophic vaginitis: Secondary | ICD-10-CM | POA: Diagnosis not present

## 2018-01-12 DIAGNOSIS — Z01419 Encounter for gynecological examination (general) (routine) without abnormal findings: Secondary | ICD-10-CM | POA: Diagnosis not present

## 2018-01-12 DIAGNOSIS — M8580 Other specified disorders of bone density and structure, unspecified site: Secondary | ICD-10-CM

## 2018-01-12 DIAGNOSIS — Z139 Encounter for screening, unspecified: Secondary | ICD-10-CM | POA: Diagnosis not present

## 2018-01-12 DIAGNOSIS — Z124 Encounter for screening for malignant neoplasm of cervix: Secondary | ICD-10-CM

## 2018-01-12 DIAGNOSIS — E118 Type 2 diabetes mellitus with unspecified complications: Secondary | ICD-10-CM | POA: Diagnosis not present

## 2018-01-12 DIAGNOSIS — Z0000 Encounter for general adult medical examination without abnormal findings: Secondary | ICD-10-CM

## 2018-01-12 MED ORDER — ESTRADIOL 10 MCG VA TABS: ORAL_TABLET | VAGINAL | 3 refills | Status: DC

## 2018-01-12 MED ORDER — AMITRIPTYLINE HCL 10 MG PO TABS: 10.0000 mg | ORAL_TABLET | Freq: Every day | ORAL | 3 refills | Status: DC

## 2018-01-12 NOTE — Addendum Note (Signed)
Addended by: Sumner Boast E on: 01/12/2018 05:00 PM   Modules accepted: Orders

## 2018-01-12 NOTE — Patient Instructions (Signed)
EXERCISE AND DIET:  We recommended that you start or continue a regular exercise program for good health. Regular exercise means any activity that makes your heart beat faster and makes you sweat.  We recommend exercising at least 30 minutes per day at least 3 days a week, preferably 4 or 5.  We also recommend a diet low in fat and sugar.  Inactivity, poor dietary choices and obesity can cause diabetes, heart attack, stroke, and kidney damage, among others.    ALCOHOL AND SMOKING:  Women should limit their alcohol intake to no more than 7 drinks/beers/glasses of wine (combined, not each!) per week. Moderation of alcohol intake to this level decreases your risk of breast cancer and liver damage. And of course, no recreational drugs are part of a healthy lifestyle.  And absolutely no smoking or even second hand smoke. Most people know smoking can cause heart and lung diseases, but did you know it also contributes to weakening of your bones? Aging of your skin?  Yellowing of your teeth and nails?  CALCIUM AND VITAMIN D:  Adequate intake of calcium and Vitamin D are recommended.  The recommendations for exact amounts of these supplements seem to change often, but generally speaking 1,200 mg of calcium (between diet and supplement) and 800 units of Vitamin D per day seems prudent. Certain women may benefit from higher intake of Vitamin D.  If you are among these women, your doctor will have told you during your visit.    PAP SMEARS:  Pap smears, to check for cervical cancer or precancers,  have traditionally been done yearly, although recent scientific advances have shown that most women can have pap smears less often.  However, every woman still should have a physical exam from her gynecologist every year. It will include a breast check, inspection of the vulva and vagina to check for abnormal growths or skin changes, a visual exam of the cervix, and then an exam to evaluate the size and shape of the uterus and  ovaries.  And after 65 years of age, a rectal exam is indicated to check for rectal cancers. We will also provide age appropriate advice regarding health maintenance, like when you should have certain vaccines, screening for sexually transmitted diseases, bone density testing, colonoscopy, mammograms, etc.   MAMMOGRAMS:  All women over 40 years old should have a yearly mammogram. Many facilities now offer a "3D" mammogram, which may cost around $50 extra out of pocket. If possible,  we recommend you accept the option to have the 3D mammogram performed.  It both reduces the number of women who will be called back for extra views which then turn out to be normal, and it is better than the routine mammogram at detecting truly abnormal areas.    COLON CANCER SCREENING: Now recommend starting at age 45. At this time colonoscopy is not covered for routine screening until 50. There are take home tests that can be done between 45-49.   COLONOSCOPY:  Colonoscopy to screen for colon cancer is recommended for all women at age 50.  We know, you hate the idea of the prep.  We agree, BUT, having colon cancer and not knowing it is worse!!  Colon cancer so often starts as a polyp that can be seen and removed at colonscopy, which can quite literally save your life!  And if your first colonoscopy is normal and you have no family history of colon cancer, most women don't have to have it again for   10 years.  Once every ten years, you can do something that may end up saving your life, right?  We will be happy to help you get it scheduled when you are ready.  Be sure to check your insurance coverage so you understand how much it will cost.  It may be covered as a preventative service at no cost, but you should check your particular policy.      Breast Self-Awareness Breast self-awareness means being familiar with how your breasts look and feel. It involves checking your breasts regularly and reporting any changes to your  health care provider. Practicing breast self-awareness is important. A change in your breasts can be a sign of a serious medical problem. Being familiar with how your breasts look and feel allows you to find any problems early, when treatment is more likely to be successful. All women should practice breast self-awareness, including women who have had breast implants. How to do a breast self-exam One way to learn what is normal for your breasts and whether your breasts are changing is to do a breast self-exam. To do a breast self-exam: Look for Changes  1. Remove all the clothing above your waist. 2. Stand in front of a mirror in a room with good lighting. 3. Put your hands on your hips. 4. Push your hands firmly downward. 5. Compare your breasts in the mirror. Look for differences between them (asymmetry), such as: ? Differences in shape. ? Differences in size. ? Puckers, dips, and bumps in one breast and not the other. 6. Look at each breast for changes in your skin, such as: ? Redness. ? Scaly areas. 7. Look for changes in your nipples, such as: ? Discharge. ? Bleeding. ? Dimpling. ? Redness. ? A change in position. Feel for Changes Carefully feel your breasts for lumps and changes. It is best to do this while lying on your back on the floor and again while sitting or standing in the shower or tub with soapy water on your skin. Feel each breast in the following way:  Place the arm on the side of the breast you are examining above your head.  Feel your breast with the other hand.  Start in the nipple area and make  inch (2 cm) overlapping circles to feel your breast. Use the pads of your three middle fingers to do this. Apply light pressure, then medium pressure, then firm pressure. The light pressure will allow you to feel the tissue closest to the skin. The medium pressure will allow you to feel the tissue that is a little deeper. The firm pressure will allow you to feel the tissue  close to the ribs.  Continue the overlapping circles, moving downward over the breast until you feel your ribs below your breast.  Move one finger-width toward the center of the body. Continue to use the  inch (2 cm) overlapping circles to feel your breast as you move slowly up toward your collarbone.  Continue the up and down exam using all three pressures until you reach your armpit.  Write Down What You Find  Write down what is normal for each breast and any changes that you find. Keep a written record with breast changes or normal findings for each breast. By writing this information down, you do not need to depend only on memory for size, tenderness, or location. Write down where you are in your menstrual cycle, if you are still menstruating. If you are having trouble noticing differences   in your breasts, do not get discouraged. With time you will become more familiar with the variations in your breasts and more comfortable with the exam. How often should I examine my breasts? Examine your breasts every month. If you are breastfeeding, the best time to examine your breasts is after a feeding or after using a breast pump. If you menstruate, the best time to examine your breasts is 5-7 days after your period is over. During your period, your breasts are lumpier, and it may be more difficult to notice changes. When should I see my health care provider? See your health care provider if you notice:  A change in shape or size of your breasts or nipples.  A change in the skin of your breast or nipples, such as a reddened or scaly area.  Unusual discharge from your nipples.  A lump or thick area that was not there before.  Pain in your breasts.  Anything that concerns you.  

## 2018-01-13 LAB — COMPREHENSIVE METABOLIC PANEL
ALT: 11 IU/L (ref 0–32)
AST: 17 IU/L (ref 0–40)
Albumin/Globulin Ratio: 2 (ref 1.2–2.2)
Albumin: 4.3 g/dL (ref 3.6–4.8)
Alkaline Phosphatase: 64 IU/L (ref 39–117)
BUN/Creatinine Ratio: 18 (ref 12–28)
BUN: 15 mg/dL (ref 8–27)
Bilirubin Total: 0.6 mg/dL (ref 0.0–1.2)
CO2: 25 mmol/L (ref 20–29)
Calcium: 9.4 mg/dL (ref 8.7–10.3)
Chloride: 100 mmol/L (ref 96–106)
Creatinine, Ser: 0.83 mg/dL (ref 0.57–1.00)
GFR calc non Af Amer: 74 mL/min/{1.73_m2} (ref >59)
GFR, EST AFRICAN AMERICAN: 86 mL/min/{1.73_m2} (ref >59)
Globulin, Total: 2.1 g/dL (ref 1.5–4.5)
Glucose: 103 mg/dL — ABNORMAL HIGH (ref 65–99)
Potassium: 4.1 mmol/L (ref 3.5–5.2)
Sodium: 140 mmol/L (ref 134–144)
Total Protein: 6.4 g/dL (ref 6.0–8.5)

## 2018-01-13 LAB — LIPID PANEL
Chol/HDL Ratio: 2 ratio (ref 0.0–4.4)
Cholesterol, Total: 224 mg/dL — ABNORMAL HIGH (ref 100–199)
HDL: 112 mg/dL (ref >39)
LDL Calculated: 98 mg/dL (ref 0–99)
Triglycerides: 72 mg/dL (ref 0–149)
VLDL Cholesterol Cal: 14 mg/dL (ref 5–40)

## 2018-01-13 LAB — CBC
Hematocrit: 41.5 % (ref 34.0–46.6)
Hemoglobin: 14.5 g/dL (ref 11.1–15.9)
MCH: 33.5 pg — ABNORMAL HIGH (ref 26.6–33.0)
MCHC: 34.9 g/dL (ref 31.5–35.7)
MCV: 96 fL (ref 79–97)
Platelets: 254 10*3/uL (ref 150–450)
RBC: 4.33 x10E6/uL (ref 3.77–5.28)
RDW: 11.5 % — ABNORMAL LOW (ref 12.3–15.4)
WBC: 5.4 10*3/uL (ref 3.4–10.8)

## 2018-01-16 ENCOUNTER — Other Ambulatory Visit: Payer: Self-pay | Admitting: Obstetrics and Gynecology

## 2018-01-16 DIAGNOSIS — M8580 Other specified disorders of bone density and structure, unspecified site: Secondary | ICD-10-CM

## 2018-01-16 DIAGNOSIS — E2839 Other primary ovarian failure: Secondary | ICD-10-CM

## 2018-01-16 LAB — CYTOLOGY - PAP
Diagnosis: NEGATIVE
HPV: NOT DETECTED

## 2018-01-16 LAB — SPECIMEN STATUS REPORT

## 2018-01-16 LAB — HGB A1C W/O EAG: HEMOGLOBIN A1C: 5.2 % (ref 4.8–5.6)

## 2018-02-16 ENCOUNTER — Telehealth: Payer: Self-pay | Admitting: Family Medicine

## 2018-02-16 DIAGNOSIS — J019 Acute sinusitis, unspecified: Secondary | ICD-10-CM | POA: Diagnosis not present

## 2018-02-16 DIAGNOSIS — J069 Acute upper respiratory infection, unspecified: Secondary | ICD-10-CM | POA: Diagnosis not present

## 2018-02-16 NOTE — Telephone Encounter (Signed)
I informed pt we did not have anything available today. Pt stated she would go to [facility] clinic.   Copied from [place] [phone]. Topic: Appointment Scheduling - Scheduling Inquiry for Clinic >> Feb 16, 2018  9:22 AM Reyne Dumas L wrote: Reason for CRM:   Pt called to get a new pt appt with Dr. Juleen China (she is scheduled in May, that was first available).  Pt wants to know if she can be seen today for coughing, head congestion, not sleeping through the night, nasal drainage.  States that she feels this may be a sinus infection.  Pt not reporting a fever.

## 2018-02-18 DIAGNOSIS — M25561 Pain in right knee: Secondary | ICD-10-CM | POA: Diagnosis not present

## 2018-02-18 DIAGNOSIS — M1711 Unilateral primary osteoarthritis, right knee: Secondary | ICD-10-CM | POA: Diagnosis not present

## 2018-02-25 DIAGNOSIS — M25561 Pain in right knee: Secondary | ICD-10-CM | POA: Diagnosis not present

## 2018-03-09 DIAGNOSIS — M1711 Unilateral primary osteoarthritis, right knee: Secondary | ICD-10-CM | POA: Diagnosis not present

## 2018-03-11 ENCOUNTER — Other Ambulatory Visit: Payer: Medicare HMO

## 2018-03-11 DIAGNOSIS — R69 Illness, unspecified: Secondary | ICD-10-CM | POA: Diagnosis not present

## 2018-03-23 DIAGNOSIS — R69 Illness, unspecified: Secondary | ICD-10-CM | POA: Diagnosis not present

## 2018-03-26 ENCOUNTER — Ambulatory Visit
Admission: RE | Admit: 2018-03-26 | Discharge: 2018-03-26 | Disposition: A | Discharge status: Home / Self Care | Payer: Medicare HMO | Source: Ambulatory Visit | Attending: Obstetrics and Gynecology | Admitting: Obstetrics and Gynecology

## 2018-03-26 DIAGNOSIS — Z780 Asymptomatic menopausal state: Secondary | ICD-10-CM | POA: Diagnosis not present

## 2018-03-26 DIAGNOSIS — M8580 Other specified disorders of bone density and structure, unspecified site: Secondary | ICD-10-CM

## 2018-03-26 DIAGNOSIS — E2839 Other primary ovarian failure: Secondary | ICD-10-CM

## 2018-03-26 DIAGNOSIS — M85851 Other specified disorders of bone density and structure, right thigh: Secondary | ICD-10-CM | POA: Diagnosis not present

## 2018-03-31 DIAGNOSIS — H5212 Myopia, left eye: Secondary | ICD-10-CM | POA: Diagnosis not present

## 2018-03-31 DIAGNOSIS — H04122 Dry eye syndrome of left lacrimal gland: Secondary | ICD-10-CM | POA: Diagnosis not present

## 2018-03-31 DIAGNOSIS — H04121 Dry eye syndrome of right lacrimal gland: Secondary | ICD-10-CM | POA: Diagnosis not present

## 2018-06-02 ENCOUNTER — Ambulatory Visit: Payer: Medicare HMO | Admitting: Family Medicine

## 2018-06-08 DIAGNOSIS — R69 Illness, unspecified: Secondary | ICD-10-CM | POA: Diagnosis not present

## 2018-06-10 DIAGNOSIS — G4700 Insomnia, unspecified: Secondary | ICD-10-CM | POA: Diagnosis not present

## 2018-06-10 DIAGNOSIS — R000 Tachycardia, unspecified: Secondary | ICD-10-CM | POA: Diagnosis not present

## 2018-06-10 DIAGNOSIS — Z136 Encounter for screening for cardiovascular disorders: Secondary | ICD-10-CM | POA: Diagnosis not present

## 2018-06-16 ENCOUNTER — Other Ambulatory Visit: Payer: Self-pay | Admitting: Radiology

## 2018-06-16 ENCOUNTER — Ambulatory Visit: Payer: Medicare HMO | Admitting: Family Medicine

## 2018-06-16 ENCOUNTER — Telehealth: Payer: Self-pay | Admitting: Radiology

## 2018-06-16 DIAGNOSIS — R002 Palpitations: Secondary | ICD-10-CM

## 2018-06-16 NOTE — Telephone Encounter (Signed)
Enrolled patient for a 7 Day [employer] Event Monitor to be mailed. Brief instructions were gone over with the patient and she knows to expect the monitor to arrive in 3-4 days

## 2018-06-29 ENCOUNTER — Ambulatory Visit (INDEPENDENT_AMBULATORY_CARE_PROVIDER_SITE_OTHER): Payer: Medicare HMO

## 2018-06-29 DIAGNOSIS — R000 Tachycardia, unspecified: Secondary | ICD-10-CM | POA: Diagnosis not present

## 2018-06-29 DIAGNOSIS — R002 Palpitations: Secondary | ICD-10-CM

## 2018-07-07 ENCOUNTER — Other Ambulatory Visit: Payer: Self-pay

## 2018-07-07 DIAGNOSIS — H04123 Dry eye syndrome of bilateral lacrimal glands: Secondary | ICD-10-CM | POA: Diagnosis not present

## 2018-07-08 ENCOUNTER — Other Ambulatory Visit: Payer: Self-pay | Admitting: Physician Assistant

## 2018-07-08 DIAGNOSIS — L821 Other seborrheic keratosis: Secondary | ICD-10-CM | POA: Diagnosis not present

## 2018-07-08 DIAGNOSIS — L570 Actinic keratosis: Secondary | ICD-10-CM | POA: Diagnosis not present

## 2018-07-08 DIAGNOSIS — D485 Neoplasm of uncertain behavior of skin: Secondary | ICD-10-CM | POA: Diagnosis not present

## 2018-07-08 DIAGNOSIS — D229 Melanocytic nevi, unspecified: Secondary | ICD-10-CM | POA: Diagnosis not present

## 2018-07-09 ENCOUNTER — Telehealth: Payer: Self-pay | Admitting: Physical Therapy

## 2018-07-09 NOTE — Telephone Encounter (Signed)
Copied from [place] [id number]. Topic: Appointment Scheduling - Scheduling Inquiry for Clinic >> Jul 09, 2018 11:41 AM Margot Ables wrote: Reason for CRM: Pt wanting to [facility] 6/26 appt with Dr. Juleen China to 8/4-8/5 if possible.

## 2018-07-13 DIAGNOSIS — K644 Residual hemorrhoidal skin tags: Secondary | ICD-10-CM | POA: Diagnosis not present

## 2018-07-13 DIAGNOSIS — Z86010 Personal history of colonic polyps: Secondary | ICD-10-CM | POA: Diagnosis not present

## 2018-07-13 DIAGNOSIS — K635 Polyp of colon: Secondary | ICD-10-CM | POA: Diagnosis not present

## 2018-07-13 DIAGNOSIS — K643 Fourth degree hemorrhoids: Secondary | ICD-10-CM | POA: Diagnosis not present

## 2018-07-13 DIAGNOSIS — D123 Benign neoplasm of transverse colon: Secondary | ICD-10-CM | POA: Diagnosis not present

## 2018-07-13 LAB — HM COLONOSCOPY

## 2018-07-17 ENCOUNTER — Ambulatory Visit: Payer: Medicare HMO | Admitting: Family Medicine

## 2018-07-20 DIAGNOSIS — M1711 Unilateral primary osteoarthritis, right knee: Secondary | ICD-10-CM | POA: Diagnosis not present

## 2018-07-22 DIAGNOSIS — Z136 Encounter for screening for cardiovascular disorders: Secondary | ICD-10-CM | POA: Diagnosis not present

## 2018-07-22 DIAGNOSIS — R000 Tachycardia, unspecified: Secondary | ICD-10-CM | POA: Diagnosis not present

## 2018-07-27 DIAGNOSIS — M1711 Unilateral primary osteoarthritis, right knee: Secondary | ICD-10-CM | POA: Diagnosis not present

## 2018-07-29 DIAGNOSIS — M25512 Pain in left shoulder: Secondary | ICD-10-CM | POA: Diagnosis not present

## 2018-08-03 DIAGNOSIS — M1711 Unilateral primary osteoarthritis, right knee: Secondary | ICD-10-CM | POA: Diagnosis not present

## 2018-08-19 ENCOUNTER — Telehealth: Payer: Self-pay | Admitting: *Deleted

## 2018-08-19 NOTE — Telephone Encounter (Signed)
Spoke with patient to cancel her appointment with Dr. Margaretann Loveless on 08/25/18. She would like an appointment with anyone in the office that could see her in office. I told her that someone will contact her to reschedule her with another provider.

## 2018-08-31 ENCOUNTER — Ambulatory Visit: Payer: Medicare HMO | Admitting: Internal Medicine

## 2018-09-14 DIAGNOSIS — M1711 Unilateral primary osteoarthritis, right knee: Secondary | ICD-10-CM | POA: Diagnosis not present

## 2018-09-14 DIAGNOSIS — M7542 Impingement syndrome of left shoulder: Secondary | ICD-10-CM | POA: Diagnosis not present

## 2018-09-14 DIAGNOSIS — M25512 Pain in left shoulder: Secondary | ICD-10-CM | POA: Diagnosis not present

## 2018-09-15 ENCOUNTER — Ambulatory Visit: Payer: Medicare HMO | Admitting: Family Medicine

## 2018-09-23 ENCOUNTER — Other Ambulatory Visit: Payer: Self-pay

## 2018-09-23 ENCOUNTER — Ambulatory Visit: Payer: Medicare HMO | Admitting: Cardiology

## 2018-09-23 VITALS — BP 120/68 | HR 73 | Temp 98.4°F | Ht 67.0 in | Wt 140.2 lb

## 2018-09-23 DIAGNOSIS — I479 Paroxysmal tachycardia, unspecified: Secondary | ICD-10-CM | POA: Diagnosis not present

## 2018-09-23 DIAGNOSIS — Z7189 Other specified counseling: Secondary | ICD-10-CM

## 2018-09-23 DIAGNOSIS — R002 Palpitations: Secondary | ICD-10-CM | POA: Diagnosis not present

## 2018-09-23 DIAGNOSIS — M25512 Pain in left shoulder: Secondary | ICD-10-CM | POA: Diagnosis not present

## 2018-09-23 NOTE — Progress Notes (Signed)
Cardiology Office Note:    Date:  09/23/2018   ID:  Madison Carr, DOB [birth year], MRN [id number]  PCP:  Briscoe Deutscher, MD  Cardiologist:  Buford Dresser, MD PhD  Referring MD: Lyman Bishop, DO   CC: new consult for evaluation of tachycardia  History of Present Illness:    Madison Carr is a 68 y.o. female with a hx of insomnia who is seen as a new consult at the request of Lyman Bishop, DO for the evaluation and management of tachycardia.  Patient concerns today: why is she have spells out of the blue of fast heartbeats. Has seen rates as high as 185 on her apple watch. Longest episode was one hour, most much shorter. Also feels heart heart almost vibrating/fluttering when she rolls over in the middle of the night.   Tachycardia/palpitations: -Initial onset: been probably about 2 years but had been very infrequent -Frequency/Duration: haven't had in some time, per her log none since the end of June. At the most frequent, happening once every 30 days. -Associated symptoms: shortness of breath, lightheadedness -Aggravating/alleviating factors: no clear associations -Syncope/near syncope: none -Prior cardiac history: none -Prior ECG: NSR -Prior workup: monitor 07/07/18 showed sinus rhythm with sinus tach with one nondiagnostic episode (read by Dr. Curt Bears) -Prior treatment: none -Possible medication interactions: only amitriptyline -Caffeine: 1 cup of coffee in AM, then 2-3 iced teas late morning/lunch, none after 1 PM -Alcohol: 2 glasses of wine/day -Tobacco: never -OTC supplements: all listed except for CBD oil (recently started) -Comorbidities: insomnia -Exercise level: less active with Covid, had been going to the gym routinely, had a trainer twice a week, play tennis, etc. Tries to get >10,000 steps per day. Has been active at home but less cardio. -Labs: TSH, kidney function/electrolytes, CBC reviewed. -Cardiac ROS: no chest pain, no shortness of breath, no  PND, no orthopnea, no LE edema. -Family history:  Father is deceased, had some type of heart rhythm issue that he took a pill for. No MI or CVA.  Past Medical History:  Diagnosis Date  . Basal cell carcinoma    nose  . Fibromyalgia   . Insomnia   . Postmenopausal atrophic vaginitis 5/09    Past Surgical History:  Procedure Laterality Date  . BASAL CELL CARCINOMA EXCISION  2008 ?   nose  . COLPOSCOPY    . COLPOSCOPY W/ BIOPSY / CURETTAGE  07/11/2008   benign with focal koilocytic atypia, followup remained ASCUS with neg. HR HPV through 2013  . ENDOMETRIAL ABLATION  2005  . LAPAROSCOPIC RIGHT COLECTOMY N/A 08/25/2015   Procedure: Diagnostic LAPAROSCOPY;  Surgeon: Jackolyn Confer, MD;  Location: [facility];  Service: General;  Laterality: N/A;  . PARTIAL COLECTOMY  08/25/2015   Procedure: Extended right COLECTOMY;  Surgeon: Jackolyn Confer, MD;  Location: [facility];  Service: General;;  . TONSILLECTOMY AND ADENOIDECTOMY  1968    Current Medications: Current Outpatient Medications on File Prior to Visit  Medication Sig  . amitriptyline (ELAVIL) 10 MG tablet Take 1 tablet (10 mg total) by mouth at bedtime.  . Ascorbic Acid (VITAMIN C) 500 MG CAPS Take 1 capsule by mouth daily.  Marland Kitchen CALCIUM-MAGNESIUM-ZINC PO Take by mouth. Calcium 1000mg  Magnesium 400mg   Zinc 25mg   . cholecalciferol (VITAMIN D) 1000 UNITS tablet Take 1,000 Units by mouth daily.  . Cyanocobalamin (VITAMIN B-12 PO) Take 1,000 mg by mouth daily. Vitamin B12  . diclofenac sodium (VOLTAREN) 1 % GEL diclofenac 1 % topical gel  APPLY 4  GRAMS TO THE AFFECTED AREA(S) BY TOPICAL ROUTE 4 TIMES PER DAY  . Estradiol (YUVAFEM) 10 MCG TABS vaginal tablet PLACE 1 TABLET (10 MCG TOTAL) VAGINALLY 2 (TWO) TIMES A WEEK.  Marland Kitchen LORazepam (ATIVAN) 1 MG tablet Take 1 mg by mouth at bedtime as needed for sleep.   . Multiple Vitamins-Minerals (OCUVITE ADULT 50+) CAPS Take 1 capsule by mouth daily.   No current facility-administered medications on file  prior to visit.      Allergies:   Patient has no known allergies.   Social History   Socioeconomic History  . Marital status: Married    Spouse name: Not on file  . Number of children: Not on file  . Years of education: Not on file  . Highest education level: Not on file  Occupational History  . Not on file  Social Needs  . Financial resource strain: Not on file  . Food insecurity    Worry: Not on file    Inability: Not on file  . Transportation needs    Medical: Not on file    Non-medical: Not on file  Tobacco Use  . Smoking status: Never Smoker  . Smokeless tobacco: Never Used  Substance and Sexual Activity  . Alcohol use: Yes    Alcohol/week: 5.0 standard drinks    Types: 5 Standard drinks or equivalent per week  . Drug use: No  . Sexual activity: Yes    Partners: Male    Birth control/protection: Post-menopausal  Lifestyle  . Physical activity    Days per week: Not on file    Minutes per session: Not on file  . Stress: Not on file  Relationships  . Social Herbalist on phone: Not on file    Gets together: Not on file    Attends religious service: Not on file    Active member of club or organization: Not on file    Attends meetings of clubs or organizations: Not on file    Relationship status: Not on file  Other Topics Concern  . Not on file  Social History Narrative  . Not on file     Family History: The patient's family history includes Heart disease in her father; Macular degeneration in her father, paternal aunt, and paternal uncle; Melanoma (age of onset: 68) in her mother. There is no history of Breast cancer. Father is deceased, had some type of heart rhythm issue that he took a pill for. No MI or CVA.  ROS:   Please see the history of present illness.  Additional pertinent ROS: Constitutional: Negative for chills, fever, night sweats, unintentional weight loss  HENT: Negative for ear pain and hearing loss.   Eyes: Negative for loss of  vision and eye pain.  Respiratory: Negative for cough, sputum, wheezing.   Cardiovascular: See HPI. Gastrointestinal: Negative for abdominal pain, melena, and hematochezia.  Genitourinary: Negative for dysuria and hematuria.  Musculoskeletal: Negative for falls and myalgias.  Skin: Negative for itching and rash.  Neurological: Negative for focal weakness, focal sensory changes and loss of consciousness.  Endo/Heme/Allergies: Does not bruise/bleed easily.     EKGs/Labs/Other Studies Reviewed:    The following studies were reviewed today: Monitor from 06/29/18  EKG:  EKG is personally reviewed.  The ekg ordered today demonstrates NSR  Recent Labs: 01/12/2018: ALT 11; BUN 15; Creatinine, Ser 0.83; Hemoglobin 14.5; Platelets 254; Potassium 4.1; Sodium 140  Recent Lipid Panel    Component Value Date/Time   CHOL 224 (  H) 01/12/2018 1109   TRIG 72 01/12/2018 1109   HDL 112 01/12/2018 1109   CHOLHDL 2.0 01/12/2018 1109   LDLCALC 98 01/12/2018 1109    Physical Exam:    VS:  BP 120/68   Pulse 73   Temp 98.4 F (36.9 C) (Temporal)   Ht 5\' 7"  (1.702 m)   Wt 140 lb 3.2 oz (63.6 kg)   LMP 11/22/2003 (Approximate)   SpO2 97%   BMI 21.96 kg/m     Wt Readings from Last 3 Encounters:  09/23/18 140 lb 3.2 oz (63.6 kg)  01/12/18 132 lb (59.9 kg)  10/14/16 133 lb (60.3 kg)   GEN: Well nourished, well developed in no acute distress HEENT: Normal, moist mucous membranes NECK: No JVD CARDIAC: regular rhythm, normal S1 and S2, no murmurs, rubs, gallops.  VASCULAR: Radial and DP pulses 2+ bilaterally. No carotid bruits RESPIRATORY:  Clear to auscultation without rales, wheezing or rhonchi  ABDOMEN: Soft, non-tender, non-distended MUSCULOSKELETAL:  Ambulates independently SKIN: Warm and dry, no edema NEUROLOGIC:  Alert and oriented x 3. No focal neuro deficits noted. PSYCHIATRIC:  Normal affect    ASSESSMENT:    1. Heart palpitations   2. Paroxysmal tachycardia (HCC)   3. Cardiac  risk counseling   4. Counseling on health promotion and disease prevention    PLAN:    Tachycardia/palpitations: -unclear etiology. Had monitor placed but did not catch any significant arrhythmia. I personally reviewed the monitor report and images with here. The area of concern for ?afib is completely artifact and not interpretable. No other suggestion of afib -we discussed options. Given that the frequency of these events has decreased, and they are unpredictable, may be difficult to catch with monitor use.  -she has an Apple watch, and I reviewed her stored events. Lots of artifact, unable to completely determine rhythm but looks largely sinus -we discussed other options, such as the [employer]. I discussed the limitations to this and other devices, but we sometimes use for paroxysmal events. She will look into it -if these become more frequent, or if she has high risk event such as syncope, we will pursue monitor and echo. -We spent significant time today reviewing different parts of the cardiovascular system (electrical, vascular, functional, and valvular). We discussed how each of these systems can present with different symptoms. We reviewed that there are different ways we evaluate these symptoms with tests. We reviewed which tests I think are most appropriate given the symptoms, and we discussed risks/benefits and limitations of each of these tests. Please see summary below. We also discussed that if testing is unrevealing for a cardiac cause of the symptoms, there are many noncardiac causes as well that can contribute to symptoms. If the heart is ruled out, then I recommend returning to PCP to discuss alternative diagnoses.  Cardiac risk counseling and prevention recommendations: -recommend heart healthy/Mediterranean diet, with whole grains, fruits, vegetable, fish, lean meats, nuts, and olive oil. Limit salt. -recommend moderate walking, 3-5 times/week for 30-50 minutes each  session. Aim for at least 150 minutes.week. Goal should be pace of 3 miles/hours, or walking 1.5 miles in 30 minutes -recommend avoidance of tobacco products. Avoid excess alcohol. -she does not have any high risk cardiovascular risk factors at this time  Plan for follow up: as needed based on her symptoms.  TIME SPENT WITH PATIENT: 45 minutes of direct patient care. More than 50% of that time was spent on coordination of care and counseling regarding prior test  results, current symptoms, options for evaluation.  Buford Dresser, MD, PhD [place]  [facility]   Medication Adjustments/Labs and Tests Ordered: Current medicines are reviewed at length with the patient today.  Concerns regarding medicines are outlined above.  Orders Placed This Encounter  Procedures  . EKG 12-Lead   No orders of the defined types were placed in this encounter.   Patient Instructions  Medication Instructions:  Your Physician recommend you continue on your current medication as directed.    If you need a refill on your cardiac medications before your next appointment, please call your pharmacy.   Lab work: None  Testing/Procedures: None  Follow-Up: At [facility], you and your health needs are our priority.  As part of our continuing mission to provide you with exceptional heart care, we have created designated Provider Care Teams.  These Care Teams include your primary Cardiologist (physician) and Advanced Practice Providers (APPs -  Physician Assistants and Nurse Practitioners) who all work together to provide you with the care you need, when you need it. You will need a follow up appointment as needed.  Please call our office 2 months in advance to schedule this appointment.  You may see Dr. Harrell Gave or one of the following Advanced Practice Providers on your designated Care Team:   Rosaria Ferries, PA-C . Jory Sims, DNP, ANP  Consider Evalee Mutton from [place] to  intermittently check your rhythm    Signed, Buford Dresser, MD PhD 09/23/2018 11:44 AM    [place]

## 2018-09-23 NOTE — Patient Instructions (Addendum)
Medication Instructions:  Your Physician recommend you continue on your current medication as directed.    If you need a refill on your cardiac medications before your next appointment, please call your pharmacy.   Lab work: None  Testing/Procedures: None  Follow-Up: At [facility], you and your health needs are our priority.  As part of our continuing mission to provide you with exceptional heart care, we have created designated Provider Care Teams.  These Care Teams include your primary Cardiologist (physician) and Advanced Practice Providers (APPs -  Physician Assistants and Nurse Practitioners) who all work together to provide you with the care you need, when you need it. You will need a follow up appointment as needed.  Please call our office 2 months in advance to schedule this appointment.  You may see Dr. Harrell Gave or one of the following Advanced Practice Providers on your designated Care Team:   Rosaria Ferries, PA-C . Jory Sims, DNP, ANP  Consider [employer] from [place] to intermittently check your rhythm

## 2018-09-24 ENCOUNTER — Encounter: Payer: Self-pay | Admitting: Cardiology

## 2018-09-24 DIAGNOSIS — I479 Paroxysmal tachycardia, unspecified: Secondary | ICD-10-CM | POA: Insufficient documentation

## 2018-09-24 DIAGNOSIS — R002 Palpitations: Secondary | ICD-10-CM | POA: Insufficient documentation

## 2018-10-05 DIAGNOSIS — S43432A Superior glenoid labrum lesion of left shoulder, initial encounter: Secondary | ICD-10-CM | POA: Diagnosis not present

## 2018-10-05 DIAGNOSIS — M7542 Impingement syndrome of left shoulder: Secondary | ICD-10-CM | POA: Diagnosis not present

## 2018-10-16 ENCOUNTER — Ambulatory Visit: Payer: Medicare HMO | Admitting: Family Medicine

## 2018-10-23 DIAGNOSIS — K644 Residual hemorrhoidal skin tags: Secondary | ICD-10-CM | POA: Diagnosis not present

## 2018-10-23 DIAGNOSIS — K648 Other hemorrhoids: Secondary | ICD-10-CM | POA: Diagnosis not present

## 2018-10-23 DIAGNOSIS — Z9049 Acquired absence of other specified parts of digestive tract: Secondary | ICD-10-CM | POA: Diagnosis not present

## 2018-11-26 DIAGNOSIS — K648 Other hemorrhoids: Secondary | ICD-10-CM | POA: Diagnosis not present

## 2018-11-30 DIAGNOSIS — Z20828 Contact with and (suspected) exposure to other viral communicable diseases: Secondary | ICD-10-CM | POA: Diagnosis not present

## 2018-12-14 DIAGNOSIS — R69 Illness, unspecified: Secondary | ICD-10-CM | POA: Diagnosis not present

## 2019-01-08 ENCOUNTER — Other Ambulatory Visit: Payer: Self-pay | Admitting: Obstetrics and Gynecology

## 2019-01-08 DIAGNOSIS — Z01419 Encounter for gynecological examination (general) (routine) without abnormal findings: Secondary | ICD-10-CM

## 2019-01-08 NOTE — Telephone Encounter (Signed)
Med refill request: Yuvafem Last AEX: 01/12/2018 Next AEX:not scheduled. Pt called and will call back to schedule for Feb 2021. Pt having hemorrhoid  surgery on 01/27/2019 Last MMG (if hormonal med) 12/29/2017, BIRADs 1, negative Refill authorized: #24 tablets, 3RF  Orders pended if approved.

## 2019-01-24 DIAGNOSIS — Z01818 Encounter for other preprocedural examination: Secondary | ICD-10-CM | POA: Diagnosis not present

## 2019-01-27 DIAGNOSIS — K644 Residual hemorrhoidal skin tags: Secondary | ICD-10-CM | POA: Diagnosis not present

## 2019-01-27 DIAGNOSIS — Z9889 Other specified postprocedural states: Secondary | ICD-10-CM | POA: Insufficient documentation

## 2019-01-27 DIAGNOSIS — K648 Other hemorrhoids: Secondary | ICD-10-CM | POA: Diagnosis not present

## 2019-01-27 DIAGNOSIS — M1990 Unspecified osteoarthritis, unspecified site: Secondary | ICD-10-CM | POA: Diagnosis not present

## 2019-01-27 DIAGNOSIS — R002 Palpitations: Secondary | ICD-10-CM | POA: Diagnosis not present

## 2019-01-27 DIAGNOSIS — Z79899 Other long term (current) drug therapy: Secondary | ICD-10-CM | POA: Diagnosis not present

## 2019-01-27 DIAGNOSIS — M8580 Other specified disorders of bone density and structure, unspecified site: Secondary | ICD-10-CM | POA: Diagnosis not present

## 2019-01-27 DIAGNOSIS — Z86010 Personal history of colonic polyps: Secondary | ICD-10-CM | POA: Diagnosis not present

## 2019-01-27 HISTORY — DX: Other specified postprocedural states: Z98.890

## 2019-01-27 HISTORY — PX: HEMORROIDECTOMY: SUR656

## 2019-02-01 ENCOUNTER — Other Ambulatory Visit: Payer: Self-pay | Admitting: Obstetrics and Gynecology

## 2019-02-01 DIAGNOSIS — Z01419 Encounter for gynecological examination (general) (routine) without abnormal findings: Secondary | ICD-10-CM

## 2019-02-01 DIAGNOSIS — R339 Retention of urine, unspecified: Secondary | ICD-10-CM | POA: Diagnosis not present

## 2019-02-01 NOTE — Telephone Encounter (Signed)
Med refill request: Elavil 10 mg tablet Last AEX: 01/12/2018 Next AEX: 02/16/2019  Last MMG (if hormonal med) 01/08/2018 BIRADS 1,Negative , pt aware of needing updated MMG. Refill authorized: #90, 0 RF, orders pended if approved

## 2019-02-09 ENCOUNTER — Other Ambulatory Visit: Payer: Self-pay | Admitting: Obstetrics & Gynecology

## 2019-02-09 DIAGNOSIS — Z1231 Encounter for screening mammogram for malignant neoplasm of breast: Secondary | ICD-10-CM

## 2019-02-09 DIAGNOSIS — R339 Retention of urine, unspecified: Secondary | ICD-10-CM | POA: Diagnosis not present

## 2019-02-12 ENCOUNTER — Other Ambulatory Visit: Payer: Self-pay

## 2019-02-16 ENCOUNTER — Ambulatory Visit (INDEPENDENT_AMBULATORY_CARE_PROVIDER_SITE_OTHER): Payer: Medicare HMO | Admitting: Obstetrics & Gynecology

## 2019-02-16 ENCOUNTER — Other Ambulatory Visit: Payer: Self-pay

## 2019-02-16 ENCOUNTER — Encounter: Payer: Self-pay | Admitting: Obstetrics & Gynecology

## 2019-02-16 VITALS — BP 128/70 | HR 80 | Temp 97.6°F | Resp 10 | Ht 66.25 in | Wt 133.0 lb

## 2019-02-16 DIAGNOSIS — Z01419 Encounter for gynecological examination (general) (routine) without abnormal findings: Secondary | ICD-10-CM

## 2019-02-16 MED ORDER — ESTRADIOL 10 MCG VA TABS: ORAL_TABLET | VAGINAL | 4 refills | Status: DC

## 2019-02-16 NOTE — Patient Instructions (Signed)
[facility] at [facility]: [address] [place], [place], [place] [address] Phone: [phone]

## 2019-02-16 NOTE — Progress Notes (Signed)
69 y.o. [id number] Married White or Caucasian female here for annual exam. Patient had a hemorrhoidectomy on 01/27/19 with Dr. Quentin Cornwall.  She had urinary retention after the surgery.  Had a catheter for about a 7 days.  Did see urology.  She is having a little urinary leakage.  She does feel like she is completely emptying her bladder.   PCP:  Dr. Theodis Blaze.  Did have an appt last year.    Patient's last menstrual period was 11/22/2003 (approximate).          Sexually active: Yes.    The current method of family planning is post menopausal status.    Exercising: Yes.    personal trainer, tennis Smoker:  no  Health Maintenance: Pap:   01/12/18 Neg:Neg HR HPV  09-30-14 [facility] NEG HR HPV   10-15-13 WNL NEG HR HPV History of abnormal Pap:  Yes, colposcopy x2  MMG:  12/29/17 BIRADS 1 negative/density/b -- scheduled 03/19/19 Colonoscopy:  06/2018 Dr. Erlene Quan.  One small polyp noted.  Follow up 3 years recommended per pt.   BMD:   03/26/18 Osteopenia TDaP:  06/09/2014 Pneumonia vaccine(s):  none Shingrix:   2011 Hep C testing: 10/09/15 Negative Screening Labs: PCP   reports that she has never smoked. She has never used smokeless tobacco. She reports current alcohol use of about 5.0 standard drinks of alcohol per week. She reports that she does not use drugs.  Past Medical History:  Diagnosis Date  . Basal cell carcinoma    nose  . Fibromyalgia   . Insomnia   . Postmenopausal atrophic vaginitis 5/09    Past Surgical History:  Procedure Laterality Date  . BASAL CELL CARCINOMA EXCISION  2008 ?   nose  . COLPOSCOPY    . COLPOSCOPY W/ BIOPSY / CURETTAGE  07/11/2008   benign with focal koilocytic atypia, followup remained ASCUS with neg. HR HPV through 2013  . ENDOMETRIAL ABLATION  2005  . HEMORROIDECTOMY  01/27/2019  . LAPAROSCOPIC RIGHT COLECTOMY N/A 08/25/2015   Procedure: Diagnostic LAPAROSCOPY;  Surgeon: Jackolyn Confer, MD;  Location: [facility];  Service: General;  Laterality: N/A;  . PARTIAL COLECTOMY   08/25/2015   Procedure: Extended right COLECTOMY;  Surgeon: Jackolyn Confer, MD;  Location: [facility];  Service: General;;  . TONSILLECTOMY AND ADENOIDECTOMY  1968    Current Outpatient Medications  Medication Sig Dispense Refill  . amitriptyline (ELAVIL) 10 MG tablet TAKE 1 TABLET BY MOUTH EVERYDAY AT BEDTIME 90 tablet 0  . Ascorbic Acid (VITAMIN C) 500 MG CAPS Take 1 capsule by mouth daily.    Marland Kitchen CALCIUM-MAGNESIUM-ZINC PO Take by mouth. Calcium 1000mg  Magnesium 400mg   Zinc 25mg     . cholecalciferol (VITAMIN D) 1000 UNITS tablet Take 1,000 Units by mouth daily.    . Coenzyme Q10 50 MG CAPS Take by mouth.    . Cyanocobalamin (VITAMIN B-12 PO) Take 1,000 mg by mouth daily. Vitamin B12    . LORazepam (ATIVAN) 1 MG tablet Take 0.5 mg by mouth at bedtime as needed for sleep.     . Misc Natural Products (GLUCOSAMINE CHONDROITIN ADV PO) Take by mouth daily.    . Multiple Vitamins-Minerals (OCUVITE ADULT 50+) CAPS Take 1 capsule by mouth daily.    . Multiple Vitamins-Minerals (ZINC PO) Take 1 tablet by mouth daily.    Merril Abbe 10 MCG TABS vaginal tablet PLACE 1 TABLET (10 MCG TOTAL) VAGINALLY 2 (TWO) TIMES A WEEK. 24 tablet 0   No current facility-administered medications for this visit.  Family History  Problem Relation Age of Onset  . Melanoma Mother 25       left leg  . Heart disease Father        macular deg.  . Macular degeneration Father   . Macular degeneration Paternal Aunt   . Macular degeneration Paternal Uncle   . Breast cancer Neg Hx     Review of Systems  All other systems reviewed and are negative.   Exam:   BP 128/70 (BP Location: Right Arm, Patient Position: Sitting, Cuff Size: Normal)   Pulse 80   Temp 97.6 F (36.4 C) (Temporal)   Resp 10   Ht 5' 6.25" (1.683 m)   Wt 133 lb (60.3 kg)   LMP 11/22/2003 (Approximate)   BMI 21.31 kg/m   Height: 5' 6.25" (168.3 cm)  Ht Readings from Last 3 Encounters:  02/16/19 5' 6.25" (1.683 m)  09/23/18 5\' 7"  (1.702 m)   01/12/18 5' 6.5" (1.689 m)    General appearance: alert, cooperative and appears stated age Head: Normocephalic, without obvious abnormality, atraumatic Neck: no adenopathy, supple, symmetrical, trachea midline and thyroid normal to inspection and palpation Lungs: clear to auscultation bilaterally Breasts: normal appearance, no masses or tenderness Heart: regular rate and rhythm Abdomen: soft, non-tender; bowel sounds normal; no masses,  no organomegaly Extremities: extremities normal, atraumatic, no cyanosis or edema Skin: Skin color, texture, turgor normal. No rashes or lesions Lymph nodes: Cervical, supraclavicular, and axillary nodes normal. No abnormal inguinal nodes palpated Neurologic: Grossly normal   Pelvic: External genitalia:  no lesions              Urethra:  normal appearing urethra with no masses, tenderness or lesions              Bartholins and Skenes: normal                 Vagina: normal appearing vagina with normal color and discharge, no lesions              Cervix: no lesions              Pap taken: No. Bimanual Exam:  Uterus:  normal size, contour, position, consistency, mobility, non-tender              Adnexa: normal adnexa and no mass, fullness, tenderness               Rectovaginal: Confirms               Anus:  normal sphincter tone, no lesions  Chaperone, Terence Lux, CMA, was present for exam.  A:  Well Woman with normal exam PMP, no HRT Vaginal atrophic changes Insomnia Osteopenia  P:   Mammogram guidelines reviewed.  Doing yearly. Colonoscopy is UTD.  She reports follow up is due 3 years pap smear with eng HR HPV 12/19.  Not indicated today. Pneumonia vaccinations discussed.  Order for Pneumovax vaccination given today. Declines blood work today  BMD done 2020 Return annually or prn

## 2019-03-19 ENCOUNTER — Ambulatory Visit
Admission: RE | Admit: 2019-03-19 | Discharge: 2019-03-19 | Disposition: A | Discharge status: Home / Self Care | Payer: Medicare HMO | Source: Ambulatory Visit | Attending: Obstetrics & Gynecology | Admitting: Obstetrics & Gynecology

## 2019-03-19 ENCOUNTER — Other Ambulatory Visit: Payer: Self-pay

## 2019-03-19 DIAGNOSIS — Z1231 Encounter for screening mammogram for malignant neoplasm of breast: Secondary | ICD-10-CM | POA: Diagnosis not present

## 2019-03-22 DIAGNOSIS — H43813 Vitreous degeneration, bilateral: Secondary | ICD-10-CM | POA: Diagnosis not present

## 2019-03-22 DIAGNOSIS — H524 Presbyopia: Secondary | ICD-10-CM | POA: Diagnosis not present

## 2019-03-22 DIAGNOSIS — H04123 Dry eye syndrome of bilateral lacrimal glands: Secondary | ICD-10-CM | POA: Diagnosis not present

## 2019-05-02 ENCOUNTER — Other Ambulatory Visit: Payer: Self-pay | Admitting: Obstetrics & Gynecology

## 2019-05-02 DIAGNOSIS — Z01419 Encounter for gynecological examination (general) (routine) without abnormal findings: Secondary | ICD-10-CM

## 2019-05-03 NOTE — Telephone Encounter (Signed)
Medication refill request: Elavil  Last AEX:  02/16/2019 with MM Next AEX: 05/12/2018 Last MMG (if hormonal medication request): 03/19/2019 BI-RADS 1: Neg  Refill authorized: #[id number] pended.  Please advise and refill if appropriate.

## 2019-05-20 ENCOUNTER — Ambulatory Visit: Payer: Medicare HMO | Admitting: Physician Assistant

## 2019-05-20 ENCOUNTER — Encounter: Payer: Self-pay | Admitting: Physician Assistant

## 2019-05-20 ENCOUNTER — Other Ambulatory Visit: Payer: Self-pay

## 2019-05-20 DIAGNOSIS — L820 Inflamed seborrheic keratosis: Secondary | ICD-10-CM | POA: Diagnosis not present

## 2019-05-20 DIAGNOSIS — L570 Actinic keratosis: Secondary | ICD-10-CM | POA: Diagnosis not present

## 2019-05-20 DIAGNOSIS — R21 Rash and other nonspecific skin eruption: Secondary | ICD-10-CM

## 2019-05-20 DIAGNOSIS — Z1283 Encounter for screening for malignant neoplasm of skin: Secondary | ICD-10-CM

## 2019-05-20 DIAGNOSIS — D485 Neoplasm of uncertain behavior of skin: Secondary | ICD-10-CM | POA: Diagnosis not present

## 2019-05-20 MED ORDER — ALCLOMETASONE DIPROPIONATE 0.05 % EX CREA: TOPICAL_CREAM | Freq: Every day | CUTANEOUS | 3 refills | Status: DC

## 2019-05-20 NOTE — Patient Instructions (Signed)

## 2019-05-20 NOTE — Progress Notes (Addendum)
   Follow-Up Visit   Subjective  Madison Carr is a 69 y.o. female who presents for the following: Annual Exam (Here for yearly look over.  She feels like she is getting an increased number of keratosis.  What can she do about them because they are starting to bother her.).  Location: right eyebrow Duration: x months Quality: red patch with scale Associated Signs/Symptoms: none Modifying Factors:  None Timing: persistent Context: h/o BCC  The following portions of the chart were reviewed this encounter and updated as appropriate: Tobacco  Allergies  Meds  Problems  Med Hx  Surg Hx  Fam Hx  Soc Hx      Objective  Well appearing patient in no apparent distress; mood and affect are within normal limits.  A full examination was performed including scalp, head, eyes, ears, nose, lips, neck, chest, axillae, abdomen, back, buttocks, bilateral upper extremities, bilateral lower extremities, hands, feet, fingers, toes, fingernails, and toenails. All findings within normal limits unless otherwise noted below.  Objective  Right outer eye brow: Scaly erythematous macule  3.2 to outer eye     Objective  Head - Anterior (Face), Left Breast, Left Lower Back, Left Shoulder - Posterior, Mid Back, Mid Occipital Scalp, Mid Parietal Scalp, Neck - Anterior, Right Abdomen (side) - Lower, Right Lower Back, Scalp: Erythematous stuck-on, waxy papule or plaque.   Images    Objective  Left Upper Cutaneous Lip: Red patch after having a milium I&D  Objective  TBSE: No DN,  Assessment & Plan  Neoplasm of uncertain behavior of skin Right outer eye brow  Skin / nail biopsy Type of biopsy: tangential   Informed consent: discussed and consent obtained   Timeout: patient name, date of birth, surgical site, and procedure verified   Anesthesia: the lesion was anesthetized in a standard fashion   Anesthetic:  1% lidocaine w/ epinephrine 1-100,000 local infiltration Instrument used: flexible  razor blade   Hemostasis achieved with: aluminum chloride and electrodesiccation   Outcome: patient tolerated procedure well   Post-procedure details: wound care instructions given    Specimen 1 - Surgical pathology Differential Diagnosis: sbcc Check Margins: No  Seborrheic keratoses, inflamed (11) Head - Anterior (Face); Left Shoulder - Posterior; Neck - Anterior; Left Breast; Right Abdomen (side) - Lower; Left Lower Back; Right Lower Back; Mid Back; Mid Parietal Scalp; Mid Occipital Scalp; Scalp  Destruction of lesion - Head - Anterior (Face), Left Breast, Left Lower Back, Left Shoulder - Posterior, Mid Back, Mid Occipital Scalp, Mid Parietal Scalp, Neck - Anterior, Right Abdomen (side) - Lower, Right Lower Back, Scalp Complexity: simple   Destruction method: cryotherapy   Informed consent: discussed and consent obtained   Timeout:  patient name, date of birth, surgical site, and procedure verified Lesion destroyed using liquid nitrogen: Yes   Region frozen until ice ball extended beyond lesion: Yes   Outcome: patient tolerated procedure well with no complications   Post-procedure details: wound care instructions given    Rash and other nonspecific skin eruption Left Upper Cutaneous Lip  Observe. RTC if persistent 2 months  Screening exam for skin cancer TBSE  Yearly skin check I have reviewed the above documentation for accuracy and completeness and I agree with the above.  Robyne Askew PA-C

## 2019-05-25 ENCOUNTER — Telehealth: Payer: Self-pay

## 2019-05-25 NOTE — Telephone Encounter (Signed)
-----   Message from Warren Danes, [place] sent at 05/25/2019 11:57 AM EDT ----- RTC PRN

## 2019-05-25 NOTE — Telephone Encounter (Signed)
Phone call to patient with her pathology results. Patient aware of results.  

## 2019-07-16 DIAGNOSIS — Z23 Encounter for immunization: Secondary | ICD-10-CM | POA: Diagnosis not present

## 2019-07-22 LAB — COMPREHENSIVE METABOLIC PANEL
Calcium: 9.6 (ref 8.7–10.7)
GFR calc Af Amer: 103
GFR calc non Af Amer: 85

## 2019-07-22 LAB — CBC AND DIFFERENTIAL
HCT: 42 (ref 36–46)
Hemoglobin: 14.1 (ref 12.0–16.0)
Platelets: 242 (ref 150–399)
WBC: 4

## 2019-07-22 LAB — LIPID PANEL
Cholesterol: 210 — AB (ref 0–200)
HDL: 96 — AB (ref 35–70)
LDL Cholesterol: 94
Triglycerides: 98 (ref 40–160)

## 2019-07-22 LAB — CBC: RBC: 4.22 (ref 3.87–5.11)

## 2019-07-22 LAB — BASIC METABOLIC PANEL
CO2: 31 — AB (ref 13–22)
Chloride: 102 (ref 99–108)
Creatinine: 0.7 (ref 0.5–1.1)
Glucose: 97
Potassium: 4.3 (ref 3.4–5.3)
Sodium: 140 (ref 137–147)

## 2019-07-22 LAB — TSH: TSH: 2.17 (ref 0.41–5.90)

## 2019-09-06 DIAGNOSIS — R69 Illness, unspecified: Secondary | ICD-10-CM | POA: Diagnosis not present

## 2019-09-15 ENCOUNTER — Ambulatory Visit (INDEPENDENT_AMBULATORY_CARE_PROVIDER_SITE_OTHER): Payer: Medicare HMO | Admitting: Family Medicine

## 2019-09-15 ENCOUNTER — Encounter: Payer: Self-pay | Admitting: Family Medicine

## 2019-09-15 ENCOUNTER — Other Ambulatory Visit: Payer: Self-pay

## 2019-09-15 VITALS — BP 112/84 | HR 77 | Temp 98.0°F | Ht 66.34 in | Wt 139.2 lb

## 2019-09-15 DIAGNOSIS — F419 Anxiety disorder, unspecified: Secondary | ICD-10-CM

## 2019-09-15 DIAGNOSIS — F5101 Primary insomnia: Secondary | ICD-10-CM

## 2019-09-15 DIAGNOSIS — Z0000 Encounter for general adult medical examination without abnormal findings: Secondary | ICD-10-CM

## 2019-09-15 DIAGNOSIS — R69 Illness, unspecified: Secondary | ICD-10-CM | POA: Diagnosis not present

## 2019-09-15 DIAGNOSIS — Z23 Encounter for immunization: Secondary | ICD-10-CM

## 2019-09-15 MED ORDER — TRAZODONE HCL 50 MG PO TABS: 25.0000 mg | ORAL_TABLET | Freq: Every evening | ORAL | 0 refills | Status: DC | PRN

## 2019-09-15 MED ORDER — ZOSTER VAC RECOMB ADJUVANTED 50 MCG/0.5ML IM SUSR: 0.5000 mL | Freq: Once | INTRAMUSCULAR | 0 refills | Status: AC

## 2019-09-15 NOTE — Patient Instructions (Addendum)
Great to meet you today. Start trazodone 1/2 tab about 1 hour before bed. May increase every 5-7 days by 1/2 tab as needed until adequate sleep with out sleep hangover. Max dose 2 tabs.   Follow 4 weeks for physical and insomnia.    Insomnia Insomnia is a sleep disorder that makes it difficult to fall asleep or stay asleep. Insomnia can cause fatigue, low energy, difficulty concentrating, mood swings, and poor performance at work or school. There are three different ways to classify insomnia:  Difficulty falling asleep.  Difficulty staying asleep.  Waking up too early in the morning. Any type of insomnia can be long-term (chronic) or short-term (acute). Both are common. Short-term insomnia usually lasts for three months or less. Chronic insomnia occurs at least three times a week for longer than three months. What are the causes? Insomnia may be caused by another condition, situation, or substance, such as:  Anxiety.  Certain medicines.  Gastroesophageal reflux disease (GERD) or other gastrointestinal conditions.  Asthma or other breathing conditions.  Restless legs syndrome, sleep apnea, or other sleep disorders.  Chronic pain.  Menopause.  Stroke.  Abuse of alcohol, tobacco, or illegal drugs.  Mental health conditions, such as depression.  Caffeine.  Neurological disorders, such as Alzheimer's disease.  An overactive thyroid (hyperthyroidism). Sometimes, the cause of insomnia may not be known. What increases the risk? Risk factors for insomnia include:  Gender. Women are affected more often than men.  Age. Insomnia is more common as you get older.  Stress.  Lack of exercise.  Irregular work schedule or working night shifts.  Traveling between different time zones.  Certain medical and mental health conditions. What are the signs or symptoms? If you have insomnia, the main symptom is having trouble falling asleep or having trouble staying asleep. This  may lead to other symptoms, such as:  Feeling fatigued or having low energy.  Feeling nervous about going to sleep.  Not feeling rested in the morning.  Having trouble concentrating.  Feeling irritable, anxious, or depressed. How is this diagnosed? This condition may be diagnosed based on:  Your symptoms and medical history. Your health care provider may ask about: ? Your sleep habits. ? Any medical conditions you have. ? Your mental health.  A physical exam. How is this treated? Treatment for insomnia depends on the cause. Treatment may focus on treating an underlying condition that is causing insomnia. Treatment may also include:  Medicines to help you sleep.  Counseling or therapy.  Lifestyle adjustments to help you sleep better. Follow these instructions at home: Eating and drinking   Limit or avoid alcohol, caffeinated beverages, and cigarettes, especially close to bedtime. These can disrupt your sleep.  Do not eat a large meal or eat spicy foods right before bedtime. This can lead to digestive discomfort that can make it hard for you to sleep. Sleep habits   Keep a sleep diary to help you and your health care provider figure out what could be causing your insomnia. Write down: ? When you sleep. ? When you wake up during the night. ? How well you sleep. ? How rested you feel the next day. ? Any side effects of medicines you are taking. ? What you eat and drink.  Make your bedroom a dark, comfortable place where it is easy to fall asleep. ? Put up shades or blackout curtains to block light from outside. ? Use a white noise machine to block noise. ? Keep the temperature cool.  Limit screen use before bedtime. This includes: ? Watching TV. ? Using your smartphone, tablet, or computer.  Stick to a routine that includes going to bed and waking up at the same times every day and night. This can help you fall asleep faster. Consider making a quiet activity, such  as reading, part of your nighttime routine.  Try to avoid taking naps during the day so that you sleep better at night.  Get out of bed if you are still awake after 15 minutes of trying to sleep. Keep the lights down, but try reading or doing a quiet activity. When you feel sleepy, go back to bed. General instructions  Take over-the-counter and prescription medicines only as told by your health care provider.  Exercise regularly, as told by your health care provider. Avoid exercise starting several hours before bedtime.  Use relaxation techniques to manage stress. Ask your health care provider to suggest some techniques that may work well for you. These may include: ? Breathing exercises. ? Routines to release muscle tension. ? Visualizing peaceful scenes.  Make sure that you drive carefully. Avoid driving if you feel very sleepy.  Keep all follow-up visits as told by your health care provider. This is important. Contact a health care provider if:  You are tired throughout the day.  You have trouble in your daily routine due to sleepiness.  You continue to have sleep problems, or your sleep problems get worse. Get help right away if:  You have serious thoughts about hurting yourself or someone else. If you ever feel like you may hurt yourself or others, or have thoughts about taking your own life, get help right away. You can go to your nearest emergency department or call:  Your local emergency services (911 in the [place]  A suicide crisis helpline, such as the [facility] at (626) 763-0379. This is open 24 hours a day. Summary  Insomnia is a sleep disorder that makes it difficult to fall asleep or stay asleep.  Insomnia can be long-term (chronic) or short-term (acute).  Treatment for insomnia depends on the cause. Treatment may focus on treating an underlying condition that is causing insomnia.  Keep a sleep diary to help you and your health care  provider figure out what could be causing your insomnia. This information is not intended to replace advice given to you by your health care provider. Make sure you discuss any questions you have with your health care provider. Document Revised: 12/20/2016 Document Reviewed: 10/17/2016 Elsevier Patient Education  2020 [employer].

## 2019-09-15 NOTE — Progress Notes (Signed)
Patient ID: Madison Carr, female  DOB: Dec 30, 1950, 69 y.o.   MRN: [id number] Patient Care Team    Relationship Specialty Notifications Start End  Ma Hillock, DO PCP - General Family Medicine  09/15/19   Buford Dresser, MD PCP - Cardiology Cardiology  09/24/18   Megan Salon, MD Consulting Physician Gynecology  08/25/15   Judeth Cornfield, MD Referring Physician Colon and Rectal Surgery  09/19/19   Specialists, [facility]  Gastroenterology  09/19/19    Comment: Dr. Delorise Jackson, Herbie [place], MD Consulting Physician Orthopedic Surgery  09/19/19   Opthamology, [facility]  Ophthalmology  09/19/19   Center, [facility]  Dermatology  09/19/19     Chief Complaint  Patient presents with  . New Patient (Initial Visit)    Pt is coming from [place]. Pt c/o Sleep issues and anxiety.   . Immunizations    Pt would like 2nd shingles vaccine    Subjective:  Madison Carr is a 69 y.o.  female present for new patient establishment. All past medical history, surgical history, allergies, family history, immunizations, medications and social history were updated in the electronic medical record today. All recent labs, ED visits and hospitalizations within the last year were reviewed.  Insomnia/anxiety: Patient reports she has suffered from insomnia for many years.  She had been prescribed Ativan in the past.  She is currently prescribed amitriptyline 10 mg nightly.  She does wish to have better control of her anxiety and insomnia.  She reports having a hard time shutting off her mind to allow her to fall off to sleep.  If she wakes up she again has a hard time falling back to sleep.  Depression screen [facility] 2/9 09/15/2019  Decreased Interest 0  Down, Depressed, Hopeless 0  PHQ - 2 Score 0   GAD 7 : Generalized Anxiety Score 09/15/2019  Nervous, Anxious, on Edge 3  Control/stop worrying 2  Worry too much - different things 2  Trouble relaxing 3  Restless 3   Easily annoyed or irritable 0  Afraid - awful might happen 0  Total GAD 7 Score 13  Anxiety Difficulty Not difficult at all       Fall Risk  09/15/2019  Falls in the past year? 0  Number falls in past yr: 0  Injury with Fall? 0  Follow up Falls evaluation completed    Immunization History  Administered Date(s) Administered  . Fluad Quad(high Dose 65+) 12/14/2018  . Influenza, High Dose Seasonal PF 12/16/2017  . Influenza-Unspecified 11/28/2010, 12/05/2011, 12/11/2012, 10/05/2013, 10/28/2014, 10/06/2015  . PFIZER SARS-COV-2 Vaccination 02/26/2019, 03/19/2019  . Pneumococcal Polysaccharide-23 07/16/2019  . Tdap 06/09/2014  . Zoster 09/06/2009  . Zoster Recombinat (Shingrix) 09/15/2019   No exam data present  Past Medical History:  Diagnosis Date  . BCC (basal cell carcinoma of skin)    Right Inner Cheek [facility]) (per patient done years ago)  . 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 Fibromyalgia   . History of hemorrhoidectomy 01/27/2019  . Insomnia   . Paroxysmal tachycardia ([place])   . Postmenopausal atrophic vaginitis 5/09   No Known Allergies Past Surgical History:  Procedure Laterality Date  . APPENDECTOMY  08/25/2015   With colectomy  . BASAL CELL CARCINOMA EXCISION  2008 ?   nose  . BLEPHAROPLASTY  2004  . COLPOSCOPY W/ BIOPSY / CURETTAGE  07/11/2008   benign with focal koilocytic atypia, followup remained ASCUS with neg. HR HPV through 2013  .  ENDOMETRIAL ABLATION  2005  . HEMORROIDECTOMY  01/27/2019  . LAPAROSCOPIC RIGHT COLECTOMY N/A 08/25/2015   Procedure: Diagnostic LAPAROSCOPY;  Surgeon: Jackolyn Confer, MD;  Location: [facility];  Service: General;  Laterality: N/A;  . PARTIAL COLECTOMY  08/25/2015   Procedure: Extended right COLECTOMY;  Surgeon: Jackolyn Confer, MD;  Location: [facility];  Service: General;;  . [place]  . TONSILLECTOMY AND ADENOIDECTOMY  1968   Family History  Problem Relation Age of Onset  . Melanoma Mother 25       left leg  .  Arthritis Mother   . Miscarriages / [place] Mother   . Heart disease Father   . Macular degeneration Father   . Macular degeneration Paternal Aunt   . Macular degeneration Paternal Uncle   . Cancer Paternal Grandmother   . Arthritis Son   . Healthy Daughter   . Breast cancer Neg Hx    Social History   Social History Narrative   Marital status/children/pets: Married.  2 children.   Education/employment: Bachelor's degree.  Retired Child psychotherapist:      -smoke alarm in the home:Yes     - wears seatbelt: Yes     - Feels safe in their relationships: Yes    Allergies as of 09/15/2019   No Known Allergies     Medication List       Accurate as of September 15, 2019 11:59 PM. If you have any questions, ask your nurse or doctor.        STOP taking these medications   amitriptyline 10 MG tablet Commonly known as: ELAVIL Stopped by: Howard Pouch, DO   LORazepam 1 MG tablet Commonly known as: ATIVAN Stopped by: Howard Pouch, DO     TAKE these medications   alclomethasone 0.05 % cream Commonly known as: ACLOVATE Apply topically daily.   CALCIUM-MAGNESIUM-ZINC PO Take by mouth. Calcium 1000mg  Magnesium 400mg   Zinc 25mg    cholecalciferol 1000 units tablet Commonly known as: VITAMIN D Take 1,000 Units by mouth daily.   Coenzyme Q10 50 MG Caps Take by mouth.   GLUCOSAMINE CHONDROITIN ADV PO Take by mouth daily.   Ocuvite Adult 50+ Caps Take 1 capsule by mouth daily.   traZODone 50 MG tablet Commonly known as: DESYREL Take 0.5-1.5 tablets (25-75 mg total) by mouth at bedtime as needed for sleep. Started by: Howard Pouch, DO   VITAMIN B-12 PO Take 1,000 mg by mouth daily. Vitamin B12   Vitamin C 500 MG Caps Take 1 capsule by mouth daily.   Yuvafem 10 MCG Tabs vaginal tablet Generic drug: Estradiol Take 1 tablet by mouth 2 (two) times a week.   ZINC PO Take 1 tablet by mouth daily.   Zoster Vaccine Adjuvanted injection Commonly known as:  SHINGRIX Inject 0.5 mLs into the muscle once for 1 dose. Started by: Howard Pouch, DO       All past medical history, surgical history, allergies, family history, immunizations andmedications were updated in the EMR today and reviewed under the history and medication portions of their EMR.    No results found for this or any previous visit (from the past 2160 hour(s)).  MM 3D SCREEN BREAST BILATERAL   ROS: 14 pt review of systems performed and negative (unless mentioned in an HPI)  Objective: BP 112/84 (BP Location: Left Arm, Patient Position: Sitting)   Pulse 77   Temp 98 F (36.7 C)   Ht 5' 6.34" (1.685 m)   Wt 139 lb  3.2 oz (63.1 kg)   LMP 11/22/2003 (Approximate)   SpO2 98%   BMI 22.24 kg/m  Gen: Afebrile. No acute distress. Nontoxic in appearance, well-developed, well-nourished, pleasant female HENT: AT. [place].  No cough.  No hoarseness. Eyes:Pupils Equal Round Reactive to light, Extraocular movements intact,  Conjunctiva without redness, discharge or icterus. Neck/lymp/endocrine: Supple, no lymphadenopathy, no thyromegaly CV: RRR  Chest: CTAB, no wheeze, rhonchi or crackles. Neuro/Msk:  Normal gait. PERLA. EOMi. Alert. Oriented x3.   Psych: Normal affect, dress and demeanor. Normal speech. Normal thought content and judgment.   Assessment/plan: Madison Carr is a 69 y.o. female present for  Encounter for medical examination to establish care Primary insomnia Could use better coverage.  With her already dry eye syndrome would not increase amitriptyline dose. DC amitriptyline Start trazodone taper 25-100 mg nightly.  Patient was provided with proper tapering technique.  Need for shingles vaccine Printed Shingrix No. 2 for her today. - Zoster Vaccine Adjuvanted [facility]) injection; Inject 0.5 mLs into the muscle once for 1 dose.  Dispense: 0.5 mL; Refill: 0    Return in about 4 weeks (around 10/13/2019) for CPE (30 min).  No orders of the defined types were placed  in this encounter.  Meds ordered this encounter  Medications  . traZODone (DESYREL) 50 MG tablet    Sig: Take 0.5-1.5 tablets (25-75 mg total) by mouth at bedtime as needed for sleep.    Dispense:  135 tablet    Refill:  0  . Zoster Vaccine Adjuvanted [facility]) injection    Sig: Inject 0.5 mLs into the muscle once for 1 dose.    Dispense:  0.5 mL    Refill:  0   Referral Orders  No referral(s) requested today     Note is dictated utilizing voice recognition software. Although note has been proof read prior to signing, occasional typographical errors still can be missed. If any questions arise, please do not hesitate to call for verification.  Electronically signed by: Howard Pouch, DO [place]

## 2019-09-19 ENCOUNTER — Encounter: Payer: Self-pay | Admitting: Family Medicine

## 2019-09-19 DIAGNOSIS — H04123 Dry eye syndrome of bilateral lacrimal glands: Secondary | ICD-10-CM | POA: Insufficient documentation

## 2019-09-19 DIAGNOSIS — F419 Anxiety disorder, unspecified: Secondary | ICD-10-CM | POA: Insufficient documentation

## 2019-09-22 ENCOUNTER — Encounter: Payer: Self-pay | Admitting: Physician Assistant

## 2019-09-22 ENCOUNTER — Other Ambulatory Visit: Payer: Self-pay

## 2019-09-22 ENCOUNTER — Ambulatory Visit: Payer: Medicare HMO | Admitting: Physician Assistant

## 2019-09-22 DIAGNOSIS — D044 Carcinoma in situ of skin of scalp and neck: Secondary | ICD-10-CM | POA: Diagnosis not present

## 2019-09-22 DIAGNOSIS — Z85828 Personal history of other malignant neoplasm of skin: Secondary | ICD-10-CM | POA: Diagnosis not present

## 2019-09-22 DIAGNOSIS — L820 Inflamed seborrheic keratosis: Secondary | ICD-10-CM | POA: Diagnosis not present

## 2019-09-22 DIAGNOSIS — D485 Neoplasm of uncertain behavior of skin: Secondary | ICD-10-CM

## 2019-09-22 DIAGNOSIS — L570 Actinic keratosis: Secondary | ICD-10-CM | POA: Diagnosis not present

## 2019-09-22 DIAGNOSIS — C4492 Squamous cell carcinoma of skin, unspecified: Secondary | ICD-10-CM

## 2019-09-22 HISTORY — DX: Squamous cell carcinoma of skin, unspecified: C44.92

## 2019-09-22 NOTE — Patient Instructions (Signed)

## 2019-09-22 NOTE — Progress Notes (Signed)
   Follow up Visit  Subjective  Madison Carr is a 70 y.o. female who presents for the following: Skin Problem (2 spots- on scalp, back and right arm- ? LN2). Also rough spot on left shoulder and under right arm. Scabbed spot on scalp that has been frozen at least twice.   Objective  Well appearing patient in no apparent distress; mood and affect are within normal limits.  A focused examination was performed including scalp, left shouler, right axilla. Relevant physical exam findings are noted in the Assessment and Plan.   Objective  Mid Parietal Scalp (3): Erythematous patches with gritty scale.  Objective  Mid Parietal Scalp (2): Erythematous stuck-on, waxy papule or plaque.   Objective  Mid Frontal Scalp: Scabbed papule     Objective  Dorsum of Nose, Right Malar Cheek: Years ago at another practice  Assessment & Plan  AK (actinic keratosis) (3) Mid Parietal Scalp  Destruction of lesion - Mid Parietal Scalp (2) Complexity: simple   Destruction method: cryotherapy   Informed consent: discussed and consent obtained   Timeout:  patient name, date of birth, surgical site, and procedure verified Lesion destroyed using liquid nitrogen: Yes   Outcome: patient tolerated procedure well with no complications    Inflamed seborrheic keratosis (2) Mid Parietal Scalp  Destruction of lesion - Mid Parietal Scalp (2) Complexity: simple   Destruction method: cryotherapy   Informed consent: discussed and consent obtained   Timeout:  patient name, date of birth, surgical site, and procedure verified Lesion destroyed using liquid nitrogen: Yes   Outcome: patient tolerated procedure well with no complications    Neoplasm of uncertain behavior of skin Mid Frontal Scalp  Skin / nail biopsy Type of biopsy: tangential   Informed consent: discussed and consent obtained   Timeout: patient name, date of birth, surgical site, and procedure verified   Procedure prep:  Patient was  prepped and draped in usual sterile fashion (Non sterile) Prep type:  Chlorhexidine Anesthesia: the lesion was anesthetized in a standard fashion   Anesthetic:  1% lidocaine w/ epinephrine 1-100,000 local infiltration Instrument used: flexible razor blade   Outcome: patient tolerated procedure well   Post-procedure details: wound care instructions given    Specimen 1 - Surgical pathology Differential Diagnosis: bcc vs scc Check Margins: No  History of basal cell carcinoma (BCC) (2) Dorsum of Nose; Right Malar Cheek

## 2019-09-30 ENCOUNTER — Telehealth: Payer: Self-pay | Admitting: Physician Assistant

## 2019-09-30 NOTE — Telephone Encounter (Signed)
Results, JCB, last week

## 2019-09-30 NOTE — Telephone Encounter (Signed)
Path to patient. Made surgery appointment with Anderson Malta to have it treated.

## 2019-10-18 ENCOUNTER — Encounter: Payer: Medicare HMO | Admitting: Family Medicine

## 2019-11-04 ENCOUNTER — Encounter: Payer: Medicare HMO | Admitting: Family Medicine

## 2019-11-05 ENCOUNTER — Telehealth: Payer: Self-pay

## 2019-11-05 NOTE — Telephone Encounter (Signed)
Patient reports she has cough with phlegm, no fever. She has had COVID vaccine and also had booser this past Sunday, symptoms started 2 days ago.  Requesting acute work in today if possible.  If unable to schedule with [facility] Hold off until virtual visit with doctor who is handling Saturday clinic  Patient can be reached at [phone].

## 2019-11-05 NOTE — Telephone Encounter (Signed)
Pt can be seen at [phone] this evening

## 2019-11-07 ENCOUNTER — Encounter: Payer: Self-pay | Admitting: Family Medicine

## 2019-11-08 ENCOUNTER — Encounter: Payer: Self-pay | Admitting: Family Medicine

## 2019-11-08 ENCOUNTER — Telehealth (INDEPENDENT_AMBULATORY_CARE_PROVIDER_SITE_OTHER): Payer: Medicare HMO | Admitting: Family Medicine

## 2019-11-08 ENCOUNTER — Other Ambulatory Visit: Payer: Self-pay

## 2019-11-08 VITALS — Ht 66.34 in

## 2019-11-08 DIAGNOSIS — J069 Acute upper respiratory infection, unspecified: Secondary | ICD-10-CM

## 2019-11-08 DIAGNOSIS — R059 Cough, unspecified: Secondary | ICD-10-CM | POA: Diagnosis not present

## 2019-11-08 MED ORDER — BENZONATATE 100 MG PO CAPS: 200.0000 mg | ORAL_CAPSULE | Freq: Two times a day (BID) | ORAL | 0 refills | Status: AC | PRN

## 2019-11-08 MED ORDER — HYDROCODONE-HOMATROPINE 5-1.5 MG/5ML PO SYRP: 5.0000 mL | ORAL_SOLUTION | Freq: Two times a day (BID) | ORAL | 0 refills | Status: AC | PRN

## 2019-11-08 NOTE — Progress Notes (Signed)
Virtual Visit via Video Note I connected with Madison Carr on 11/08/19 by a video enabled telemedicine application and verified that I am speaking with the correct person using two identifiers.  Location patient: home Location provider:work office Persons participating in the virtual visit: patient, provider  I discussed the limitations of evaluation and management by telemedicine and the availability of in person appointments. The patient expressed understanding and agreed to proceed.  Chief Complaint  Patient presents with  . Cough   HPI: Madison Carr is a 69 year old female with history of anxiety and insomnia complaining of a week of nasal congestion, postnasal drainage, nonproductive cough. Initially she has some dysphonia, moderate to severe x 3 days, gradually getting better. "Little" sore throat. Negative for stridor or dysphagia.  She has not had fever, chills, headache, body aches, changes in appetite, CP, wheezing, dyspnea, abdominal pain, N/V, changes in bowel habits, or a skin rash. Negative for anosmia and ageusia.  Feeling better today. Cough is interfering with sleep, requesting something to help with cough.  No sick contact. She completed COVID-19 vaccine, she had her COVID-19 booster this past Sunday.  She has taking Mucinex plus and zinc lozenges.  ROS: See pertinent positives and negatives per HPI.  Past Medical History:  Diagnosis Date  . BCC (basal cell carcinoma of skin)    Right Inner Cheek [facility]) (per patient done years ago)  . 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 Fibromyalgia   . History of hemorrhoidectomy 01/27/2019  . Insomnia   . Paroxysmal tachycardia ([place])   . Postmenopausal atrophic vaginitis 5/09    Past Surgical History:  Procedure Laterality Date  . APPENDECTOMY  08/25/2015   With colectomy  . BASAL CELL CARCINOMA EXCISION  2008 ?   nose  . BLEPHAROPLASTY  2004  . COLPOSCOPY W/ BIOPSY / CURETTAGE  07/11/2008   benign with focal koilocytic  atypia, followup remained ASCUS with neg. HR HPV through 2013  . ENDOMETRIAL ABLATION  2005  . HEMORROIDECTOMY  01/27/2019  . LAPAROSCOPIC RIGHT COLECTOMY N/A 08/25/2015   Procedure: Diagnostic LAPAROSCOPY;  Surgeon: Jackolyn Confer, MD;  [facility];  Service: General;  Laterality: N/A;  . PARTIAL COLECTOMY  08/25/2015   Procedure: Extended right COLECTOMY;  Surgeon: Jackolyn Confer, MD;  Location: [facility];  Service: General;;  . [place]  . TONSILLECTOMY AND ADENOIDECTOMY  1968    Family History  Problem Relation Age of Onset  . Melanoma Mother 25       left leg  . Arthritis Mother   . Miscarriages / [place] Mother   . Heart disease Father   . Macular degeneration Father   . Macular degeneration Paternal Aunt   . Macular degeneration Paternal Uncle   . Cancer Paternal Grandmother   . Arthritis Son   . Healthy Daughter   . Breast cancer Neg Hx    Social History   Socioeconomic History  . Marital status: Married    Spouse name: Not on file  . Number of children: Not on file  . Years of education: Not on file  . Highest education level: Not on file  Occupational History  . Not on file  Tobacco Use  . Smoking status: Never Smoker  . Smokeless tobacco: Never Used  Vaping Use  . Vaping Use: Never used  Substance and Sexual Activity  . Alcohol use: Yes    Alcohol/week: 5.0 standard drinks    Types: 5 Standard drinks or equivalent per week  . Drug  use: No  . Sexual activity: Yes    Partners: Male    Birth control/protection: Post-menopausal  Other Topics Concern  . Not on file  Social History Narrative   Marital status/children/pets: Married.  2 children.   Education/employment: Bachelor's degree.  Retired Child psychotherapist:      -smoke alarm in the home:Yes     - wears seatbelt: Yes     - Feels safe in their relationships: Yes   Social Determinants of Health   Financial Resource Strain:   . Difficulty of Paying Living Expenses: Not on file   Food Insecurity:   . Worried About Charity fundraiser in the Last Year: Not on file  . Ran Out of Food in the Last Year: Not on file  Transportation Needs:   . Lack of Transportation (Medical): Not on file  . Lack of Transportation (Non-Medical): Not on file  Physical Activity:   . Days of Exercise per Week: Not on file  . Minutes of Exercise per Session: Not on file  Stress:   . Feeling of Stress : Not on file  Social Connections:   . Frequency of Communication with Friends and Family: Not on file  . Frequency of Social Gatherings with Friends and Family: Not on file  . Attends Religious Services: Not on file  . Active Member of Clubs or Organizations: Not on file  . Attends Archivist Meetings: Not on file  . Marital Status: Not on file  Intimate Partner Violence:   . Fear of Current or Ex-Partner: Not on file  . Emotionally Abused: Not on file  . Physically Abused: Not on file  . Sexually Abused: Not on file    Current Outpatient Medications:  .  alclomethasone (ACLOVATE) 0.05 % cream, Apply topically daily., Disp: 45 g, Rfl: 3 .  Ascorbic Acid (VITAMIN C) 500 MG CAPS, Take 1 capsule by mouth daily., Disp: , Rfl:  .  CALCIUM-MAGNESIUM-ZINC PO, Take by mouth. Calcium 1000mg  Magnesium 400mg   Zinc 25mg , Disp: , Rfl:  .  cholecalciferol (VITAMIN D) 1000 UNITS tablet, Take 1,000 Units by mouth daily., Disp: , Rfl:  .  Coenzyme Q10 50 MG CAPS, Take by mouth., Disp: , Rfl:  .  Cyanocobalamin (VITAMIN B-12 PO), Take 1,000 mg by mouth daily. Vitamin B12, Disp: , Rfl:  .  Misc Natural Products (GLUCOSAMINE CHONDROITIN ADV PO), Take by mouth daily., Disp: , Rfl:  .  Multiple Vitamins-Minerals (OCUVITE ADULT 50+) CAPS, Take 1 capsule by mouth daily., Disp: , Rfl:  .  Multiple Vitamins-Minerals (ZINC PO), Take 1 tablet by mouth daily., Disp: , Rfl:  .  traZODone (DESYREL) 50 MG tablet, Take 0.5-1.5 tablets (25-75 mg total) by mouth at bedtime as needed for sleep., Disp: 135  tablet, Rfl: 0 .  YUVAFEM 10 MCG TABS vaginal tablet, Take 1 tablet by mouth 2 (two) times a week., Disp: , Rfl:  .  benzonatate (TESSALON) 100 MG capsule, Take 2 capsules (200 mg total) by mouth 2 (two) times daily as needed for up to 10 days., Disp: 30 capsule, Rfl: 0 .  HYDROcodone-homatropine (HYCODAN) 5-1.5 MG/5ML syrup, Take 5 mLs by mouth every 12 (twelve) hours as needed for up to 10 days for cough., Disp: 100 mL, Rfl: 0  EXAM:  VITALS per patient if applicable:Ht 5' 1.49" (1.685 m)   LMP 11/22/2003 (Approximate)   BMI 22.24 kg/m   GENERAL: alert, oriented, appears well and in no acute distress  HEENT: atraumatic,  conjunttiva clear, no obvious abnormalities on inspection of external nose and ears. No dysphonia appreciated.  NECK: normal movements of the head and neck  LUNGS: on inspection no signs of respiratory distress, breathing rate appears normal, no obvious gross SOB, gasping or wheezing  CV: no obvious cyanosis  PSYCH/NEURO: pleasant and cooperative, no obvious depression or anxiety, speech and thought processing grossly intact  ASSESSMENT AND PLAN:  Discussed the following assessment and plan:  Cough - Plan: HYDROcodone-homatropine (HYCODAN) 5-1.5 MG/5ML syrup, benzonatate (TESSALON) 100 MG capsule Explained that cough and congestion can last a few more days and even weeks after acute symptoms have resolved. Adequate hydration. Recommend Hycodan 5 mL at bedtime, she can take medication twice daily as needed. Benzonatate may also help. We discussed side effects of medications. I do not think imaging needed at this time but if she is not any better in 1 to 2 weeks, he needs to be considered. Instructed about warning signs.  URI, acute Viral infection most likely and symptoms getting better. I do not think antibiotic treatment is needed at this time. Continue symptomatic treatment. Monitor for new symptoms. Follow-up as needed.  I discussed the assessment and  treatment plan with the patient. Madison Carr was provided an opportunity to ask questions and all were answered. She agreed with the plan and demonstrated an understanding of the instructions.   Return if symptoms worsen or fail to improve.    Madison Bircher Martinique, MD

## 2019-11-08 NOTE — Telephone Encounter (Signed)
Spoke with pt who c/o cough. Pt was informed that sched is full today. Pt was offered UC but declined. Pt will like CB to see if there is another provider avail to be see her today .

## 2019-11-08 NOTE — Telephone Encounter (Signed)
Spoke with regarding appt. With Dr. Martinique at [phone]. Pt will be available 10-15 mins prior.

## 2019-11-11 ENCOUNTER — Other Ambulatory Visit: Payer: Self-pay

## 2019-11-11 ENCOUNTER — Telehealth: Payer: Self-pay | Admitting: Family Medicine

## 2019-11-11 NOTE — Telephone Encounter (Signed)
She can be rescheduled after she has no symptoms. She will have lab work at that visit so it has to be an in office visit.

## 2019-11-11 NOTE — Telephone Encounter (Signed)
Patient has physical scheduled for Monday, 11/15/19. Today, she reports still having cough and was seen virtually by Dr. Martinique on 11/08/19. Please review visit notes and advise me if she can keep physical appt 11/15/19.

## 2019-11-11 NOTE — Telephone Encounter (Signed)
Patient has been rescheduled for 12/08/19.

## 2019-11-15 ENCOUNTER — Encounter: Payer: Medicare HMO | Admitting: Family Medicine

## 2019-12-06 ENCOUNTER — Encounter: Payer: Self-pay | Admitting: Family Medicine

## 2019-12-06 ENCOUNTER — Telehealth (INDEPENDENT_AMBULATORY_CARE_PROVIDER_SITE_OTHER): Payer: Medicare HMO | Admitting: Family Medicine

## 2019-12-06 ENCOUNTER — Telehealth: Payer: Self-pay

## 2019-12-06 DIAGNOSIS — J209 Acute bronchitis, unspecified: Secondary | ICD-10-CM

## 2019-12-06 MED ORDER — DOXYCYCLINE HYCLATE 100 MG PO TABS: 100.0000 mg | ORAL_TABLET | Freq: Two times a day (BID) | ORAL | 0 refills | Status: DC

## 2019-12-06 MED ORDER — HYDROCODONE-HOMATROPINE 5-1.5 MG/5ML PO SYRP
5.0000 mL | ORAL_SOLUTION | Freq: Every day | ORAL | 0 refills | Status: DC
Start: 2019-12-06 — End: 2020-01-25

## 2019-12-06 MED ORDER — HYDROCODONE-HOMATROPINE 5-1.5 MG PO TABS: ORAL_TABLET | ORAL | 0 refills | Status: DC

## 2019-12-06 MED ORDER — BENZONATATE 200 MG PO CAPS: 200.0000 mg | ORAL_CAPSULE | Freq: Two times a day (BID) | ORAL | 0 refills | Status: DC | PRN

## 2019-12-06 NOTE — Telephone Encounter (Signed)
completed

## 2019-12-06 NOTE — Telephone Encounter (Signed)
Patient called to say that [facility] does not have   HYDROcodone-Homatropine 5-1.5 MG TABS [[id number]  But they do have the liquid.  Patient request to change to liquid so patient can pick up at pharmacy today.  [facility] [address]

## 2019-12-06 NOTE — Addendum Note (Signed)
Addended by: Howard Pouch A on: 12/06/2019 04:48 PM   Modules accepted: Orders

## 2019-12-06 NOTE — Progress Notes (Addendum)
VIRTUAL VISIT VIA VIDEO  I connected with [name] on 12/06/19 at  2:45 PM EST by elemedicine application and verified that I am speaking with the correct person using two identifiers. Location patient: Home Location provider: [facility], Office Persons participating in the virtual visit: Patient, Dr. Raoul Pitch and Samul Dada, CMA  I discussed the limitations of evaluation and management by telemedicine and the availability of in person appointments. The patient expressed understanding and agreed to proceed.   SUBJECTIVE Chief Complaint  Patient presents with  . Cough    pt c/o cough and congestion x 1 mo; worsen last night pt states she now having sore throat, CP, and now productive cough with brownish tint     HPI: Madison Carr is a 69 y.o. female present for cough and nasal congestion, sinus pressure. She has had her covid series and booster. No fever or chills.  Productive cough. Burning chest with cough. Now going on over 1 month.  ROS: See pertinent positives and negatives per HPI.  Patient Active Problem List   Diagnosis Date Noted  . Dry eyes 09/19/2019  . Anxiety 09/19/2019  . Paroxysmal tachycardia ([place]) 09/24/2018  . Insomnia 11/30/2016  . Hx of adenomatous colonic polyps 08/25/2015  . Postmenopausal HRT (hormone replacement therapy) 10/09/2014    Social History   Tobacco Use  . Smoking status: Never Smoker  . Smokeless tobacco: Never Used  Substance Use Topics  . Alcohol use: Yes    Alcohol/week: 5.0 standard drinks    Types: 5 Standard drinks or equivalent per week    Current Outpatient Medications:  .  alclomethasone (ACLOVATE) 0.05 % cream, Apply topically daily., Disp: 45 g, Rfl: 3 .  amitriptyline (ELAVIL) 10 MG tablet, Take 10 mg by mouth at bedtime., Disp: , Rfl:  .  Ascorbic Acid (VITAMIN C) 500 MG CAPS, Take 1 capsule by mouth daily., Disp: , Rfl:  .  CALCIUM-MAGNESIUM-ZINC PO, Take by mouth. Calcium 1000mg  Magnesium 400mg   Zinc 25mg ,  Disp: , Rfl:  .  cholecalciferol (VITAMIN D) 1000 UNITS tablet, Take 1,000 Units by mouth daily., Disp: , Rfl:  .  Coenzyme Q10 50 MG CAPS, Take by mouth., Disp: , Rfl:  .  Cyanocobalamin (VITAMIN B-12 PO), Take 1,000 mg by mouth daily. Vitamin B12, Disp: , Rfl:  .  Misc Natural Products (GLUCOSAMINE CHONDROITIN ADV PO), Take by mouth daily., Disp: , Rfl:  .  Multiple Vitamins-Minerals (OCUVITE ADULT 50+) CAPS, Take 1 capsule by mouth daily., Disp: , Rfl:  .  Multiple Vitamins-Minerals (ZINC PO), Take 1 tablet by mouth daily., Disp: , Rfl:  .  SHINGRIX injection, , Disp: , Rfl:  .  traZODone (DESYREL) 50 MG tablet, Take 0.5-1.5 tablets (25-75 mg total) by mouth at bedtime as needed for sleep., Disp: 135 tablet, Rfl: 0 .  YUVAFEM 10 MCG TABS vaginal tablet, Take 1 tablet by mouth 2 (two) times a week., Disp: , Rfl:  .  benzonatate (TESSALON) 200 MG capsule, Take 1 capsule (200 mg total) by mouth 2 (two) times daily as needed for cough., Disp: 20 capsule, Rfl: 0 .  doxycycline (VIBRA-TABS) 100 MG tablet, Take 1 tablet (100 mg total) by mouth 2 (two) times daily., Disp: 20 tablet, Rfl: 0 .  HYDROcodone-homatropine (HYCODAN) 5-1.5 MG/5ML syrup, Take 5 mLs by mouth at bedtime., Disp: 120 mL, Rfl: 0  No Known Allergies  OBJECTIVE: LMP 11/22/2003 (Approximate)  Gen: No acute distress. Nontoxic in appearance.  HENT: AT. [place].  MMM.  Eyes:Pupils Equal Round Reactive to light, Extraocular movements intact,  Conjunctiva without redness, discharge or icterus. Chest: Cough mild today, no shortness of breath Skin: no rashes, purpura or petechiae.  Neuro:  Normal gait. Alert. Oriented x3  Psych: Normal affect and demeanor. Normal speech. Normal thought content and judgment.  ASSESSMENT AND PLAN: [name] is a 69 y.o. female present for  1. Acute bronchitis with symptoms > 10 days Rest, hydrate.  +/- flonase, mucinex (DM if cough), nettie pot or nasal saline.  Doxy, tessalon perles prescribed, take  until completed.  Hycodan tabs for night time cough If cough present it can last up to 6-8 weeks.  F/U 2 weeks of not improved.         Meds ordered this encounter  Medications  . doxycycline (VIBRA-TABS) 100 MG tablet    Sig: Take 1 tablet (100 mg total) by mouth 2 (two) times daily.    Dispense:  20 tablet    Refill:  0  . benzonatate (TESSALON) 200 MG capsule    Sig: Take 1 capsule (200 mg total) by mouth 2 (two) times daily as needed for cough.    Dispense:  20 capsule    Refill:  0  . DISCONTD: HYDROcodone-Homatropine 5-1.5 MG TABS    Sig: 1 tab PO QHS    Dispense:  15 tablet    Refill:  0  . HYDROcodone-homatropine (HYCODAN) 5-1.5 MG/5ML syrup    Sig: Take 5 mLs by mouth at bedtime.    Dispense:  120 mL    Refill:  0      [name], DO 12/06/2019   Return if symptoms worsen or fail to improve.  No orders of the defined types were placed in this encounter.  Meds ordered this encounter  Medications  . doxycycline (VIBRA-TABS) 100 MG tablet    Sig: Take 1 tablet (100 mg total) by mouth 2 (two) times daily.    Dispense:  20 tablet    Refill:  0  . benzonatate (TESSALON) 200 MG capsule    Sig: Take 1 capsule (200 mg total) by mouth 2 (two) times daily as needed for cough.    Dispense:  20 capsule    Refill:  0  . DISCONTD: HYDROcodone-Homatropine 5-1.5 MG TABS    Sig: 1 tab PO QHS    Dispense:  15 tablet    Refill:  0  . HYDROcodone-homatropine (HYCODAN) 5-1.5 MG/5ML syrup    Sig: Take 5 mLs by mouth at bedtime.    Dispense:  120 mL    Refill:  0   Referral Orders  No referral(s) requested today

## 2019-12-06 NOTE — Telephone Encounter (Signed)
Please advise medication form change

## 2019-12-07 ENCOUNTER — Other Ambulatory Visit: Payer: Self-pay

## 2019-12-07 MED ORDER — TRAZODONE HCL 50 MG PO TABS
25.0000 mg | ORAL_TABLET | Freq: Every evening | ORAL | 0 refills | Status: DC | PRN
Start: 2019-12-07 — End: 2020-01-25

## 2019-12-08 ENCOUNTER — Encounter: Payer: Medicare HMO | Admitting: Physician Assistant

## 2019-12-08 ENCOUNTER — Encounter: Payer: Medicare HMO | Admitting: Family Medicine

## 2019-12-09 ENCOUNTER — Encounter: Payer: Self-pay | Admitting: Family Medicine

## 2019-12-09 ENCOUNTER — Encounter: Payer: Medicare HMO | Admitting: Dermatology

## 2019-12-10 ENCOUNTER — Other Ambulatory Visit: Payer: Self-pay

## 2019-12-10 ENCOUNTER — Encounter: Payer: Self-pay | Admitting: Family Medicine

## 2019-12-10 ENCOUNTER — Telehealth (INDEPENDENT_AMBULATORY_CARE_PROVIDER_SITE_OTHER): Payer: Medicare HMO | Admitting: Family Medicine

## 2019-12-10 DIAGNOSIS — B001 Herpesviral vesicular dermatitis: Secondary | ICD-10-CM | POA: Insufficient documentation

## 2019-12-10 MED ORDER — VALACYCLOVIR HCL 1 G PO TABS: ORAL_TABLET | ORAL | 1 refills | Status: DC

## 2019-12-10 NOTE — Patient Instructions (Signed)
Cold Sore  A cold sore, also called a fever blister, is a small, fluid-filled sore that forms inside of the mouth or on the lips, gums, nose, chin, or cheeks. Cold sores can spread to other parts of the body, such as the eyes or fingers. Cold sores can spread from person to person (are contagious) until the sores crust over completely. Most cold sores go away within 2 weeks. What are the causes? Cold sores are caused by a virus (herpes simplex virus type 1, HSV-1). The virus can spread from person to person through close contact, such as through:  Kissing.  Touching the affected area.  Sharing personal items such as lip balm, razors, a drinking glass, or eating utensils. What increases the risk? You are more likely to develop this condition if you:  Are tired, stressed, or sick.  Are having your period (menstruating).  Are pregnant.  Take certain medicines.  Are out in cold weather or get too much sun. What are the signs or symptoms? Symptoms of a cold sore outbreak go through different stages. These are the stages of a cold sore:  Tingling, itching, or burning is felt 1-2 days before the outbreak.  Fluid-filled blisters appear on the lips, inside the mouth, on the nose, or on the cheeks.  The blisters start to ooze clear fluid.  The blisters dry up, and a yellow crust appears in their place.  The crust falls off. In some cases, other symptoms can develop during a cold sore outbreak. These can include:  Fever.  Sore throat.  Headache.  Muscle aches.  Swollen neck glands. How is this treated? There is no cure for cold sores or the virus that causes them. There is also no vaccine to prevent the virus. Most cold sores go away on their own without treatment within 2 weeks. Your doctor may prescribe medicines to:  Help with pain.  Keep the virus from growing.  Help you heal faster. Medicines may be in the form of creams, gels, pills, or a shot. Follow these  instructions at home: Medicines  Take or apply over-the-counter and prescription medicines only as told by your doctor.  Use a cotton-tip swab to apply creams or gels to your sores.  Ask your doctor if you can take lysine supplements. These may help with healing. Sore care   Do not touch the sores or pick the scabs.  Wash your hands often. Do not touch your eyes without washing your hands first.  Keep the sores clean and dry.  If told, put ice on the sores: ? Put ice in a plastic bag. ? Place a towel between your skin and the bag. ? Leave the ice on for 20 minutes, 2-3 times a day. Eating and drinking  Eat a soft, bland diet. Avoid eating hot, cold, or salty foods. These can hurt your mouth.  Use a straw if it hurts to drink out of a glass.  Eat foods that have a lot of lysine in them. These include meat, fish, and dairy products.  Avoid sugary foods, chocolates, nuts, and grains. These foods have a high amount of a substance (arginine) that can cause the virus to grow. Lifestyle  Do not kiss, have oral sex, or share personal items until your sores heal.  Stress, poor sleep, and being out in the sun can trigger outbreaks. Make sure you: ? Do activities that help you relax, such as deep breathing exercises or meditation. ? Get enough sleep. ? Apply sunscreen   on your lips before you go out in the sun. Contact a doctor if:  You have symptoms for more than 2 weeks.  You have pus coming from the sores.  You have redness that is spreading.  You have pain or irritation in your eye.  You get sores on your genitals.  Your sores do not heal within 2 weeks.  You get cold sores often. Get help right away if:  You have a fever and your symptoms suddenly get worse.  You have a headache and confusion.  You have tiredness (fatigue).  You do not want to eat as much as normal (loss of appetite).  You have a stiff neck or are sensitive to light. Summary  A cold sore is  a small, fluid-filled sore that forms inside of the mouth or on the lips, gums, nose, chin, or cheeks.  Cold sores can spread from person to person (are contagious) until the sores crust over completely. Most cold sores go away within 2 weeks.  Wash your hands often. Do not touch your eyes without washing your hands first.  Do not kiss, have oral sex, or share personal items until your sores heal.  Contact a doctor if your sores do not heal within 2 weeks. This information is not intended to replace advice given to you by your health care provider. Make sure you discuss any questions you have with your health care provider. Document Revised: 04/29/2018 Document Reviewed: 06/09/2017 Elsevier Patient Education  2020 [employer]  

## 2019-12-10 NOTE — Telephone Encounter (Signed)
Pt aware and sched for 1145

## 2019-12-10 NOTE — Progress Notes (Signed)
VIRTUAL VISIT VIA VIDEO  I connected with Madison Carr on 12/10/19 at 11:45 AM EST by elemedicine application and verified that I am speaking with the correct person using two identifiers. Location patient: Home Location provider: [facility], Office Persons participating in the virtual visit: Patient, Dr. Raoul Pitch and Samul Dada, CMA  I discussed the limitations of evaluation and management by telemedicine and the availability of in person appointments. The patient expressed understanding and agreed to proceed.   SUBJECTIVE Chief Complaint  Patient presents with  . Mouth Lesions    pt c/o of reoccurring fever blister; pt stated reappeared 3 days ago    HPI: Madison Carr is a 69 y.o. female present for recurrent cold sores. She reports usually have just a couple cold sores a year. However, over the last few months she has had a couple cold sores. She would like to have medication on hand for when this occurs.   ROS: See pertinent positives and negatives per HPI.  Patient Active Problem List   Diagnosis Date Noted  . Dry eyes 09/19/2019  . Anxiety 09/19/2019  . Paroxysmal tachycardia ([place]) 09/24/2018  . Insomnia 11/30/2016  . Hx of adenomatous colonic polyps 08/25/2015  . Postmenopausal HRT (hormone replacement therapy) 10/09/2014    Social History   Tobacco Use  . Smoking status: Never Smoker  . Smokeless tobacco: Never Used  Substance Use Topics  . Alcohol use: Yes    Alcohol/week: 5.0 standard drinks    Types: 5 Standard drinks or equivalent per week    Current Outpatient Medications:  .  alclomethasone (ACLOVATE) 0.05 % cream, Apply topically daily., Disp: 45 g, Rfl: 3 .  amitriptyline (ELAVIL) 10 MG tablet, Take 10 mg by mouth at bedtime., Disp: , Rfl:  .  Ascorbic Acid (VITAMIN C) 500 MG CAPS, Take 1 capsule by mouth daily., Disp: , Rfl:  .  benzonatate (TESSALON) 200 MG capsule, Take 1 capsule (200 mg total) by mouth 2 (two) times daily as needed for  cough., Disp: 20 capsule, Rfl: 0 .  CALCIUM-MAGNESIUM-ZINC PO, Take by mouth. Calcium 1000mg  Magnesium 400mg   Zinc 25mg , Disp: , Rfl:  .  cholecalciferol (VITAMIN D) 1000 UNITS tablet, Take 1,000 Units by mouth daily., Disp: , Rfl:  .  Coenzyme Q10 50 MG CAPS, Take by mouth., Disp: , Rfl:  .  Cyanocobalamin (VITAMIN B-12 PO), Take 1,000 mg by mouth daily. Vitamin B12, Disp: , Rfl:  .  doxycycline (VIBRA-TABS) 100 MG tablet, Take 1 tablet (100 mg total) by mouth 2 (two) times daily., Disp: 20 tablet, Rfl: 0 .  HYDROcodone-homatropine (HYCODAN) 5-1.5 MG/5ML syrup, Take 5 mLs by mouth at bedtime., Disp: 120 mL, Rfl: 0 .  Misc Natural Products (GLUCOSAMINE CHONDROITIN ADV PO), Take by mouth daily., Disp: , Rfl:  .  Multiple Vitamins-Minerals (OCUVITE ADULT 50+) CAPS, Take 1 capsule by mouth daily., Disp: , Rfl:  .  Multiple Vitamins-Minerals (ZINC PO), Take 1 tablet by mouth daily., Disp: , Rfl:  .  SHINGRIX injection, , Disp: , Rfl:  .  traZODone (DESYREL) 50 MG tablet, Take 0.5-1.5 tablets (25-75 mg total) by mouth at bedtime as needed for sleep., Disp: 135 tablet, Rfl: 0 .  YUVAFEM 10 MCG TABS vaginal tablet, Take 1 tablet by mouth 2 (two) times a week., Disp: , Rfl:   No Known Allergies  OBJECTIVE: LMP 11/22/2003 (Approximate)  Gen: No acute distress. Nontoxic in appearance.  HENT: AT. [place].  MMM. Cold sore present right  upper lip.  Eyes:Pupils Equal Round Reactive to light, Extraocular movements intact,  Conjunctiva without redness, discharge or icterus. Neuro:  Normal gait. Alert. Oriented x3  Psych: Normal affect and demeanor. Normal speech. Normal thought content and judgment.  ASSESSMENT AND PLAN: Madison Carr is a 69 y.o. female present for  Recurrent cold sores Discussed tx with valtrex for cold sore.  Start at onset of sensation of cold sore with 2000 mg valtrex, and repeat 2000mg  in 12 hours once-for a total of two doses per cold sore.  Also encouraged she pick up Abreva OTC for  topical tx.  F/u PRN  Howard Pouch, DO 12/10/2019   No follow-ups on file.  No orders of the defined types were placed in this encounter.  No orders of the defined types were placed in this encounter.  Referral Orders  No referral(s) requested today

## 2019-12-10 NOTE — Telephone Encounter (Signed)
Can be worked in as a Hospital doctor at 1145 today> tell pt about 1150 though.

## 2019-12-29 DIAGNOSIS — R69 Illness, unspecified: Secondary | ICD-10-CM | POA: Diagnosis not present

## 2020-01-24 ENCOUNTER — Other Ambulatory Visit: Payer: Self-pay

## 2020-01-25 ENCOUNTER — Ambulatory Visit (INDEPENDENT_AMBULATORY_CARE_PROVIDER_SITE_OTHER): Payer: Medicare HMO | Admitting: Family Medicine

## 2020-01-25 ENCOUNTER — Encounter: Payer: Self-pay | Admitting: Family Medicine

## 2020-01-25 VITALS — BP 145/84 | HR 76 | Temp 98.4°F | Resp 16 | Ht 67.0 in | Wt 140.0 lb

## 2020-01-25 DIAGNOSIS — M8580 Other specified disorders of bone density and structure, unspecified site: Secondary | ICD-10-CM

## 2020-01-25 DIAGNOSIS — Z131 Encounter for screening for diabetes mellitus: Secondary | ICD-10-CM

## 2020-01-25 DIAGNOSIS — R69 Illness, unspecified: Secondary | ICD-10-CM | POA: Diagnosis not present

## 2020-01-25 DIAGNOSIS — R030 Elevated blood-pressure reading, without diagnosis of hypertension: Secondary | ICD-10-CM | POA: Insufficient documentation

## 2020-01-25 DIAGNOSIS — Z0001 Encounter for general adult medical examination with abnormal findings: Secondary | ICD-10-CM

## 2020-01-25 DIAGNOSIS — Z86010 Personal history of colonic polyps: Secondary | ICD-10-CM

## 2020-01-25 DIAGNOSIS — Z0000 Encounter for general adult medical examination without abnormal findings: Secondary | ICD-10-CM

## 2020-01-25 DIAGNOSIS — F5101 Primary insomnia: Secondary | ICD-10-CM | POA: Diagnosis not present

## 2020-01-25 DIAGNOSIS — Z1231 Encounter for screening mammogram for malignant neoplasm of breast: Secondary | ICD-10-CM | POA: Diagnosis not present

## 2020-01-25 DIAGNOSIS — Z79890 Hormone replacement therapy (postmenopausal): Secondary | ICD-10-CM

## 2020-01-25 LAB — CBC
HCT: 41.7 % (ref 36.0–46.0)
Hemoglobin: 14.1 g/dL (ref 12.0–15.0)
MCHC: 33.8 g/dL (ref 30.0–36.0)
MCV: 99.4 fl (ref 78.0–100.0)
Platelets: 229 10*3/uL (ref 150.0–400.0)
RBC: 4.19 Mil/uL (ref 3.87–5.11)
RDW: 12.5 % (ref 11.5–15.5)
WBC: 3.7 10*3/uL — ABNORMAL LOW (ref 4.0–10.5)

## 2020-01-25 LAB — COMPREHENSIVE METABOLIC PANEL
ALT: 12 U/L (ref 0–35)
AST: 19 U/L (ref 0–37)
Albumin: 4.2 g/dL (ref 3.5–5.2)
Alkaline Phosphatase: 63 U/L (ref 39–117)
BUN: 15 mg/dL (ref 6–23)
CO2: 31 mEq/L (ref 19–32)
Calcium: 9.2 mg/dL (ref 8.4–10.5)
Chloride: 100 mEq/L (ref 96–112)
Creatinine, Ser: 0.69 mg/dL (ref 0.40–1.20)
GFR: 89.97 mL/min (ref >60.00)
Glucose, Bld: 102 mg/dL — ABNORMAL HIGH (ref 70–99)
Potassium: 4.2 mEq/L (ref 3.5–5.1)
Sodium: 137 mEq/L (ref 135–145)
Total Bilirubin: 0.6 mg/dL (ref 0.2–1.2)
Total Protein: 6.5 g/dL (ref 6.0–8.3)

## 2020-01-25 LAB — LIPID PANEL
Cholesterol: 220 mg/dL — ABNORMAL HIGH (ref 0–200)
HDL: 100.9 mg/dL (ref >39.00)
LDL Cholesterol: 103 mg/dL — ABNORMAL HIGH (ref 0–99)
NonHDL: 118.79
Total CHOL/HDL Ratio: 2
Triglycerides: 77 mg/dL (ref 0.0–149.0)
VLDL: 15.4 mg/dL (ref 0.0–40.0)

## 2020-01-25 LAB — TSH: TSH: 1.27 u[IU]/mL (ref 0.35–4.50)

## 2020-01-25 LAB — VITAMIN D 25 HYDROXY (VIT D DEFICIENCY, FRACTURES): VITD: 68.93 ng/mL (ref 30.00–100.00)

## 2020-01-25 LAB — HEMOGLOBIN A1C: Hgb A1c MFr Bld: 5.2 % (ref 4.6–6.5)

## 2020-01-25 MED ORDER — TRAZODONE HCL 100 MG PO TABS
100.0000 mg | ORAL_TABLET | Freq: Every evening | ORAL | 1 refills | Status: DC | PRN
Start: 2020-01-25 — End: 2020-07-18

## 2020-01-25 NOTE — Patient Instructions (Addendum)
Next appt in 5.5 mos.    Health Maintenance After Age 68 After age 12, you are at a higher risk for certain long-term diseases and infections as well as injuries from falls. Falls are a major cause of broken bones and head injuries in people who are older than age 53. Getting regular preventive care can help to keep you healthy and well. Preventive care includes getting regular testing and making lifestyle changes as recommended by your health care provider. Talk with your health care provider about:  Which screenings and tests you should have. A screening is a test that checks for a disease when you have no symptoms.  A diet and exercise plan that is right for you. What should I know about screenings and tests to prevent falls? Screening and testing are the best ways to find a health problem early. Early diagnosis and treatment give you the best chance of managing medical conditions that are common after age 51. Certain conditions and lifestyle choices may make you more likely to have a fall. Your health care provider may recommend:  Regular vision checks. Poor vision and conditions such as cataracts can make you more likely to have a fall. If you wear glasses, make sure to get your prescription updated if your vision changes.  Medicine review. Work with your health care provider to regularly review all of the medicines you are taking, including over-the-counter medicines. Ask your health care provider about any side effects that may make you more likely to have a fall. Tell your health care provider if any medicines that you take make you feel dizzy or sleepy.  Osteoporosis screening. Osteoporosis is a condition that causes the bones to get weaker. This can make the bones weak and cause them to break more easily.  Blood pressure screening. Blood pressure changes and medicines to control blood pressure can make you feel dizzy.  Strength and balance checks. Your health care provider may  recommend certain tests to check your strength and balance while standing, walking, or changing positions.  Foot health exam. Foot pain and numbness, as well as not wearing proper footwear, can make you more likely to have a fall.  Depression screening. You may be more likely to have a fall if you have a fear of falling, feel emotionally low, or feel unable to do activities that you used to do.  Alcohol use screening. Using too much alcohol can affect your balance and may make you more likely to have a fall. What actions can I take to lower my risk of falls? General instructions  Talk with your health care provider about your risks for falling. Tell your health care provider if: ? You fall. Be sure to tell your health care provider about all falls, even ones that seem minor. ? You feel dizzy, sleepy, or off-balance.  Take over-the-counter and prescription medicines only as told by your health care provider. These include any supplements.  Eat a healthy diet and maintain a healthy weight. A healthy diet includes low-fat dairy products, low-fat (lean) meats, and fiber from whole grains, beans, and lots of fruits and vegetables. Home safety  Remove any tripping hazards, such as rugs, cords, and clutter.  Install safety equipment such as grab bars in bathrooms and safety rails on stairs.  Keep rooms and walkways well-lit. Activity   Follow a regular exercise program to stay fit. This will help you maintain your balance. Ask your health care provider what types of exercise are appropriate for  you.  If you need a cane or walker, use it as recommended by your health care provider.  Wear supportive shoes that have nonskid soles. Lifestyle  Do not drink alcohol if your health care provider tells you not to drink.  If you drink alcohol, limit how much you have: ? 0-1 drink a day for women. ? 0-2 drinks a day for men.  Be aware of how much alcohol is in your drink. In the [place] one drink  equals one typical bottle of beer (12 oz), one-half glass of wine (5 oz), or one shot of hard liquor (1 oz).  Do not use any products that contain nicotine or tobacco, such as cigarettes and e-cigarettes. If you need help quitting, ask your health care provider. Summary  Having a healthy lifestyle and getting preventive care can help to protect your health and wellness after age 42.  Screening and testing are the best way to find a health problem early and help you avoid having a fall. Early diagnosis and treatment give you the best chance for managing medical conditions that are more common for people who are older than age 31.  Falls are a major cause of broken bones and head injuries in people who are older than age 45. Take precautions to prevent a fall at home.  Work with your health care provider to learn what changes you can make to improve your health and wellness and to prevent falls. This information is not intended to replace advice given to you by your health care provider. Make sure you discuss any questions you have with your health care provider. Document Revised: 04/30/2018 Document Reviewed: 11/20/2016 Elsevier Patient Education  2020 [employer].

## 2020-01-25 NOTE — Progress Notes (Signed)
This visit occurred during the SARS-CoV-2 public health emergency.  Safety protocols were in place, including screening questions prior to the visit, additional usage of staff PPE, and extensive cleaning of exam room while observing appropriate contact time as indicated for disinfecting solutions.    Patient ID: Madison Carr, female  DOB: 1950/10/11, 70 y.o.   MRN: [id number] Patient Care Team    Relationship Specialty Notifications Start End  Ma Hillock, DO PCP - General Family Medicine  09/15/19   Buford Dresser, MD PCP - Cardiology Cardiology  09/24/18   Megan Salon, MD Consulting Physician Gynecology  08/25/15   Judeth Cornfield, MD Referring Physician Colon and Rectal Surgery  09/19/19   Specialists, [facility]  Gastroenterology  09/19/19    Comment: Dr. Delorise Jackson, Herbie [place], MD Consulting Physician Orthopedic Surgery  09/19/19   Opthamology, [facility]  Ophthalmology  09/19/19   [facility]  Dermatology  09/19/19     Chief Complaint  Patient presents with  . Annual Exam    Fasting labs    Subjective:  [name] is a 70 y.o.  Female  present for CPE/cmc. All past medical history, surgical history, allergies, family history, immunizations, medications and social history were updated in the electronic medical record today. All recent labs, ED visits and hospitalizations within the last year were reviewed.  Health maintenance:  Colonoscopy: completed 06/2018, by  [facility] follow up 10 yr Mammogram: completed:03/19/2019, [facility]> ordered by GYN- Dr. Sabra Heck. > ordered for her today. Cervical cancer screening:> 65 Immunizations: tdap UTD 2016, Influenza UTD 2021 (encouraged yearly), PNA series started 07/16/2019 with prevnar> PNA23 due 07/14/2020, zostavax -shingrix completed, covid series completed with booster Infectious disease screening: Hep C completed DEXA: last completed 03/26/2018>-1.8> rpt 3-5 yrs.  Assistive device:  none Oxygen ATF:TDDU Patient has a Dental home. Hospitalizations/ED visits: reviewed  Insomnia/anxiety: Patient reports she has suffered from insomnia for many years.  She had been prescribed Ativan in the past.  She is currently prescribed amitriptyline 10 mg nightly.  She does wish to have better control of her anxiety and insomnia.  She reports having a hard time shutting off her mind to allow her to fall off to sleep.  If she wakes up she again has a hard time falling back to sleep.   Depression screen [facility] 2/9 09/15/2019  Decreased Interest 0  Down, Depressed, Hopeless 0  PHQ - 2 Score 0   GAD 7 : Generalized Anxiety Score 09/15/2019  Nervous, Anxious, on Edge 3  Control/stop worrying 2  Worry too much - different things 2  Trouble relaxing 3  Restless 3  Easily annoyed or irritable 0  Afraid - awful might happen 0  Total GAD 7 Score 13  Anxiety Difficulty Not difficult at all    Immunization History  Administered Date(s) Administered  . Fluad Quad(high Dose 65+) 12/14/2018, 12/29/2019  . Influenza Split 11/28/2010, 12/05/2011, 12/11/2012, 10/28/2014, 12/14/2018  . Influenza, High Dose Seasonal PF 12/16/2017  . Influenza,inj,Quad PF,6+ Mos 10/06/2015  . Influenza,inj,quad, With Preservative 10/05/2013  . Influenza-Unspecified 11/28/2010, 12/05/2011, 12/11/2012, 10/05/2013, 10/28/2014, 10/06/2015  . PFIZER SARS-COV-2 Vaccination 02/26/2019, 03/19/2019, 10/31/2019  . Pneumococcal Polysaccharide-23 07/16/2019  . Tdap 06/09/2014  . Zoster 09/06/2009, 07/16/2019  . Zoster Recombinat (Shingrix) 07/16/2019, 09/15/2019     Past Medical History:  Diagnosis Date  . BCC (basal cell carcinoma of skin)    Right [facility]) (per patient done years ago)  . 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  Fibromyalgia   . History of hemorrhoidectomy 01/27/2019  . Insomnia   . Paroxysmal tachycardia ([place])   . Postmenopausal atrophic vaginitis 5/09   Allergies  Allergen Reactions  .  Erythromycin Other (See Comments)   Past Surgical History:  Procedure Laterality Date  . APPENDECTOMY  08/25/2015   With colectomy  . BASAL CELL CARCINOMA EXCISION  2008 ?   nose  . BLEPHAROPLASTY  2004  . COLPOSCOPY W/ BIOPSY / CURETTAGE  07/11/2008   benign with focal koilocytic atypia, followup remained ASCUS with neg. HR HPV through 2013  . ENDOMETRIAL ABLATION  2005  . HEMORROIDECTOMY  01/27/2019  . LAPAROSCOPIC RIGHT COLECTOMY N/A 08/25/2015   Procedure: Diagnostic LAPAROSCOPY;  Surgeon: Jackolyn Confer, MD;  Location: [facility];  Service: General;  Laterality: N/A;  . PARTIAL COLECTOMY  08/25/2015   Procedure: Extended right COLECTOMY;  Surgeon: Jackolyn Confer, MD;  Location: [facility];  Service: General;;  . [place]  . TONSILLECTOMY AND ADENOIDECTOMY  1968   Family History  Problem Relation Age of Onset  . Melanoma Mother 25       left leg  . Arthritis Mother   . Miscarriages / [place] Mother   . Heart disease Father   . Macular degeneration Father   . Macular degeneration Paternal Aunt   . Macular degeneration Paternal Uncle   . Cancer Paternal Grandmother   . Arthritis Son   . Healthy Daughter   . Breast cancer Neg Hx    Social History   Social History Narrative   Marital status/children/pets: Married.  2 children.   Education/employment: Bachelor's degree.  Retired Child psychotherapist:      -smoke alarm in the home:Yes     - wears seatbelt: Yes     - Feels safe in their relationships: Yes    Allergies as of 01/25/2020      Reactions   Erythromycin Other (See Comments)      Medication List       Accurate as of January 25, 2020 12:19 PM. If you have any questions, ask your nurse or doctor.        STOP taking these medications   alclomethasone 0.05 % cream Commonly known as: ACLOVATE Stopped by: Howard Pouch, DO   benzonatate 200 MG capsule Commonly known as: TESSALON Stopped by: Howard Pouch, DO   doxycycline 100 MG tablet Commonly  known as: VIBRA-TABS Stopped by: Howard Pouch, DO   HYDROcodone-homatropine 5-1.5 MG/5ML syrup Commonly known as: HYCODAN Stopped by: Howard Pouch, DO     TAKE these medications   amitriptyline 10 MG tablet Commonly known as: ELAVIL Take 10 mg by mouth at bedtime.   CALCIUM-MAGNESIUM-ZINC PO Take by mouth. Calcium 1043m Magnesium 4033m Zinc 2584m cholecalciferol 1000 units tablet Commonly known as: VITAMIN D Take 1,000 Units by mouth daily.   Coenzyme Q10 50 MG Caps Take by mouth.   GLUCOSAMINE CHONDROITIN ADV PO Take by mouth daily.   Ocuvite Adult 50+ Caps Take 1 capsule by mouth daily.   Shingrix injection Generic drug: Zoster Vaccine Adjuvanted   traZODone 100 MG tablet Commonly known as: DESYREL Take 1 tablet (100 mg total) by mouth at bedtime as needed for sleep. What changed:   medication strength  how much to take Changed by: RenHoward PouchO   valACYclovir 1000 MG tablet Commonly known as: VALTREX 2 tabs at onset, then repeat 2 tabs in 12 hours once.   VITAMIN B-12 PO Take 1,000  mg by mouth daily. Vitamin B12   Vitamin C 500 MG Caps Take 1 capsule by mouth daily.   Yuvafem 10 MCG Tabs vaginal tablet Generic drug: Estradiol Take 1 tablet by mouth 2 (two) times a week.   ZINC PO Take 1 tablet by mouth daily.       All past medical history, surgical history, allergies, family history, immunizations andmedications were updated in the EMR today and reviewed under the history and medication portions of their EMR.     No results found for this or any previous visit (from the past 2160 hour(s)).  MM 3D SCREEN BREAST BILATERAL  Result Date: 03/22/2019 CLINICAL DATA:  Screening. EXAM: DIGITAL SCREENING BILATERAL MAMMOGRAM WITH TOMO AND CAD COMPARISON:  Previous exam(s). ACR Breast Density Category b: There are scattered areas of fibroglandular density. FINDINGS: There are no findings suspicious for malignancy. Images were processed with CAD.  IMPRESSION: No mammographic evidence of malignancy. A result letter of this screening mammogram will be mailed directly to the patient. RECOMMENDATION: Screening mammogram in one year. (Code:SM-B-01Y) BI-RADS CATEGORY  1: Negative. Electronically Signed   By: Everlean Alstrom M.D.   On: 03/22/2019 10:16     ROS: 14 pt review of systems performed and negative (unless mentioned in an HPI)  Objective: BP (!) 145/84 (BP Location: Right Arm, Patient Position: Sitting, Cuff Size: Normal)   Pulse 76   Temp 98.4 F (36.9 C) (Oral)   Resp 16   Ht _0  (1.702 m)   Wt 140 lb (63.5 kg)   LMP 11/22/2003 (Approximate)   SpO2 100%   BMI 21.93 kg/m  Gen: Afebrile. No acute distress. Nontoxic in appearance, well-developed, well-nourished, pleasant female HENT: AT. [place]. Bilateral TM visualized and normal in appearance, normal external auditory canal. MMM, no oral lesions, adequate dentition. Bilateral nares within normal limits. Throat without erythema, ulcerations or exudates.  No cough on exam, no hoarseness on exam. Eyes:Pupils Equal Round Reactive to light, Extraocular movements intact,  Conjunctiva without redness, discharge or icterus. Neck/lymp/endocrine: Supple, no lymphadenopathy, no thyromegaly CV: RRR no murmur, no edema, +2/4 P posterior tibialis pulses.  Chest: CTAB, no wheeze, rhonchi or crackles.  Normal respiratory effort.  Good air movement. Abd: Soft.  Flat. NTND. BS present.  No masses palpated. No hepatosplenomegaly. No rebound tenderness or guarding. Skin: No rashes, purpura or petechiae. Warm and well-perfused. Skin intact. Neuro/Msk:  Normal gait. PERLA. EOMi. Alert. Oriented x3.  Cranial nerves II through XII intact. Muscle strength 5/5 upper/lower extremity. DTRs equal bilaterally. Psych: Normal affect, dress and demeanor. Normal speech. Normal thought content and judgment.   No exam data present  Assessment/plan: CALIYAH SIEH is a 70 y.o. female present for CPE/CMC Primary  insomnia Improved> but still could use mild increase in coverage DC amitriptyline altogerther> she can take one tab QOD for 7 days then stop.  increase trazodone 100 mg nightly TSH collected today  Osteopenia, unspecified location Continue vitamin D supplementation - TSH - Vitamin D (25 hydroxy)  Postmenopausal HRT (hormone replacement therapy) Managed by gynecology - CBC - Comp Met (CMET) - Lipid panel  Hx of adenomatous colonic polyps UTD colon cancer screen  Diabetes mellitus screening - Hemoglobin A1c  Breast cancer screening by mammogram - MM 3D SCREEN BREAST BILATERAL; Future  Elevated BP without diagnosis of hypertension Blood pressure mildly over goal.  This is new for her and she typically runs a low normal blood pressure. She has a blood pressure cuff at home and will  continue to monitor her blood pressure over the next few days. Close follow-up will be arranged to recheck blood pressure and start medication if necessary. In the meantime low-sodium/heart healthy diet encouraged. Routine exercise encouraged.  Encounter for preventative exam with abnormal findings: Patient was encouraged to exercise greater than 150 minutes a week. Patient was encouraged to choose a diet filled with fresh fruits and vegetables, and lean meats. AVS provided to patient today for education/recommendation on gender specific health and safety maintenance. Colonoscopy: completed 06/2018, by  [facility] follow up 10 yr Mammogram: completed:03/19/2019, [facility]> ordered by GYN- Dr. Sabra Heck. > ordered for her today. Cervical cancer screening:> 65 Immunizations: tdap UTD 2016, Influenza UTD 2021 (encouraged yearly), PNA series started 07/16/2019 with prevnar> PNA23 due 07/14/2020, zostavax -shingrix completed, covid series completed with booster Infectious disease screening: Hep C completed DEXA: last completed 03/26/2018>-1.8> rpt 3-5 yrs.   Return in about 6 months (around 07/17/2020) for [place]  (30 min).  In 5-1/2 months for chronic   Orders Placed This Encounter  Procedures  . MM 3D SCREEN BREAST BILATERAL  . CBC  . Comp Met (CMET)  . Lipid panel  . Hemoglobin A1c  . TSH  . Vitamin D (25 hydroxy)   Meds ordered this encounter  Medications  . traZODone (DESYREL) 100 MG tablet    Sig: Take 1 tablet (100 mg total) by mouth at bedtime as needed for sleep.    Dispense:  90 tablet    Refill:  1   Referral Orders  No referral(s) requested today     Electronically signed by: Howard Pouch, [place]

## 2020-02-10 ENCOUNTER — Other Ambulatory Visit: Payer: Self-pay

## 2020-02-10 ENCOUNTER — Ambulatory Visit (INDEPENDENT_AMBULATORY_CARE_PROVIDER_SITE_OTHER): Payer: Medicare HMO | Admitting: Dermatology

## 2020-02-10 ENCOUNTER — Encounter: Payer: Self-pay | Admitting: Dermatology

## 2020-02-10 DIAGNOSIS — C4442 Squamous cell carcinoma of skin of scalp and neck: Secondary | ICD-10-CM

## 2020-02-10 DIAGNOSIS — C4492 Squamous cell carcinoma of skin, unspecified: Secondary | ICD-10-CM

## 2020-02-10 DIAGNOSIS — L820 Inflamed seborrheic keratosis: Secondary | ICD-10-CM

## 2020-02-10 NOTE — Patient Instructions (Signed)

## 2020-02-14 ENCOUNTER — Encounter: Payer: Self-pay | Admitting: Dermatology

## 2020-02-14 NOTE — Progress Notes (Signed)
   Follow-Up Visit   Subjective  Madison Carr is a 70 y.o. female who presents for the following: Procedure (Patient here today for CIS x 1 mid frontal scalp. Patient also wants a spot looked at on her left temple x months, per patient no bleeding just crusty and sore.).  Biopsy-proven CIS frontal scalp plus new sore crust left temple Location:  Duration:  Quality:  Associated Signs/Symptoms: Modifying Factors:  Severity:  Timing: Context: For treatment of CIS  Objective  Well appearing patient in no apparent distress; mood and affect are within normal limits. Objective  Mid Frontal Scalp: Biopsy site identified by nurse and me.  Objective  Head - Anterior (Face): Tan-pink 3 mm flat crust, I SK versus AK    A focused examination was performed including Head and neck.. Relevant physical exam findings are noted in the Assessment and Plan.   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  Destruction of lesion Complexity: simple   Destruction method: electrodesiccation and curettage   Informed consent: discussed and consent obtained   Timeout:  patient name, date of birth, surgical site, and procedure verified Anesthesia: the lesion was anesthetized in a standard fashion   Anesthetic:  1% lidocaine w/ epinephrine 1-100,000 local infiltration Curettage performed in three different directions: Yes   Curettage cycles:  3 Lesion length (cm):  1.1 Lesion width (cm):  1.1 Margin per side (cm):  0 Final wound size (cm):  1.1 Hemostasis achieved with:  ferric subsulfate Outcome: patient tolerated procedure well with no complications   Additional details:  Wound innoculated with 5 fluorouracil solution.  Inflamed seborrheic keratosis Head - Anterior (Face)  Will treat with freezing if still persists at time scalp skin cancer is rechecked.      I, Lavonna Monarch, MD, have reviewed all documentation for this visit.  The documentation on  02/14/20 for the exam, diagnosis, procedures, and orders are all accurate and complete.

## 2020-02-15 DIAGNOSIS — L821 Other seborrheic keratosis: Secondary | ICD-10-CM | POA: Diagnosis not present

## 2020-02-15 DIAGNOSIS — L570 Actinic keratosis: Secondary | ICD-10-CM | POA: Diagnosis not present

## 2020-02-28 ENCOUNTER — Ambulatory Visit: Payer: Medicare HMO | Admitting: Family Medicine

## 2020-02-28 ENCOUNTER — Encounter: Payer: Self-pay | Admitting: Family Medicine

## 2020-02-28 ENCOUNTER — Other Ambulatory Visit: Payer: Self-pay

## 2020-02-28 ENCOUNTER — Ambulatory Visit (INDEPENDENT_AMBULATORY_CARE_PROVIDER_SITE_OTHER): Payer: Medicare HMO | Admitting: Family Medicine

## 2020-02-28 VITALS — BP 127/76 | HR 93 | Temp 98.6°F | Ht 66.0 in | Wt 143.0 lb

## 2020-02-28 DIAGNOSIS — R030 Elevated blood-pressure reading, without diagnosis of hypertension: Secondary | ICD-10-CM | POA: Diagnosis not present

## 2020-02-28 MED ORDER — HYDROCHLOROTHIAZIDE 12.5 MG PO TABS: 12.5000 mg | ORAL_TABLET | Freq: Every day | ORAL | 1 refills | Status: AC

## 2020-02-28 NOTE — Progress Notes (Signed)
This visit occurred during the SARS-CoV-2 public health emergency.  Safety protocols were in place, including screening questions prior to the visit, additional usage of staff PPE, and extensive cleaning of exam room while observing appropriate contact time as indicated for disinfecting solutions.    Madison Carr , 1950/04/27, 70 y.o., female MRN: [id number] Patient Care Team    Relationship Specialty Notifications Start End  Ma Hillock, DO PCP - General Family Medicine  09/15/19   Buford Dresser, MD PCP - Cardiology Cardiology  09/24/18   Megan Salon, MD Consulting Physician Gynecology  08/25/15   Judeth Cornfield, MD Referring Physician Colon and Rectal Surgery  09/19/19   Specialists, [facility]  Gastroenterology  09/19/19    Comment: Dr. Delorise Jackson, Herbie [place], MD Consulting Physician Orthopedic Surgery  09/19/19   Opthamology, [facility]  Ophthalmology  09/19/19   [facility] [place] [facility]  Dermatology  09/19/19   Lavonna Monarch, MD Consulting Physician Dermatology  02/10/20     Chief Complaint  Patient presents with  . Hypertension     Subjective: Pt presents for an OV for BP recheck.Her blood pressure levels at home 132-158/72-97. Patient denies chest pain, shortness of breath, dizziness or lower extremity edema.   Depression screen [facility] 2/9 02/28/2020 09/15/2019  Decreased Interest 0 0  Down, Depressed, Hopeless 0 0  PHQ - 2 Score 0 0    Allergies  Allergen Reactions  . Erythromycin Other (See Comments)   Social History   Social History Narrative   Marital status/children/pets: Married.  2 children.   Education/employment: Bachelor's degree.  Retired Child psychotherapist:      -smoke alarm in the home:Yes     - wears seatbelt: Yes     - Feels safe in their relationships: Yes   Past Medical History:  Diagnosis Date  . BCC (basal cell carcinoma of skin)    Right [facility]) (per patient done years ago)  . 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 Fibromyalgia   . History of hemorrhoidectomy 01/27/2019  . Insomnia   . Paroxysmal tachycardia ([place])   . Postmenopausal atrophic vaginitis 5/09  . Squamous cell carcinoma of skin 09/22/2019   in situ- mid frontal scalp (CX35FU)   Past Surgical History:  Procedure Laterality Date  . APPENDECTOMY  08/25/2015   With colectomy  . BASAL CELL CARCINOMA EXCISION  2008 ?   nose  . BLEPHAROPLASTY  2004  . COLPOSCOPY W/ BIOPSY / CURETTAGE  07/11/2008   benign with focal koilocytic atypia, followup remained ASCUS with neg. HR HPV through 2013  . ENDOMETRIAL ABLATION  2005  . HEMORROIDECTOMY  01/27/2019  . LAPAROSCOPIC RIGHT COLECTOMY N/A 08/25/2015   Procedure: Diagnostic LAPAROSCOPY;  Surgeon: Jackolyn Confer, MD;  [facility];  Service: General;  Laterality: N/A;  . PARTIAL COLECTOMY  08/25/2015   Procedure: Extended right COLECTOMY;  Surgeon: Jackolyn Confer, MD;  Location: [facility];  Service: General;;  . [place]  . TONSILLECTOMY AND ADENOIDECTOMY  1968   Family History  Problem Relation Age of Onset  . Melanoma Mother 25       left leg  . Arthritis Mother   . Miscarriages / [place] Mother   . Heart disease Father   . Macular degeneration Father   . Macular degeneration Paternal Aunt   . Macular degeneration Paternal Uncle   . Cancer Paternal Grandmother   . Arthritis Son   . Healthy Daughter   .  Breast cancer Neg Hx    Allergies as of 02/28/2020      Reactions   Erythromycin Other (See Comments)      Medication List       Accurate as of February 28, 2020  3:12 PM. If you have any questions, ask your nurse or doctor.        STOP taking these medications   amitriptyline 10 MG tablet Commonly known as: ELAVIL Stopped by: Howard Pouch, DO   LORazepam 1 MG tablet Commonly known as: ATIVAN Stopped by: Howard Pouch, DO     TAKE these medications   Calcium Carbonate-Vitamin D3 600-400 MG-UNIT Tabs 1 tablet with food    CALCIUM-MAGNESIUM-ZINC PO Take by mouth. Calcium 1000mg  Magnesium 400mg   Zinc 25mg    cholecalciferol 1000 units tablet Commonly known as: VITAMIN D Take 1,000 Units by mouth daily.   Coenzyme Q10 50 MG Caps Take by mouth.   fluorouracil 5 % cream Commonly known as: EFUDEX Apply 1 application topically 2 (two) times daily.   GLUCOSAMINE CHONDROITIN ADV PO Take by mouth daily.   OSTEO BI-FLEX TRIPLE STRENGTH PO See admin instructions.   hydrochlorothiazide 12.5 MG tablet Commonly known as: HYDRODIURIL Take 1 tablet (12.5 mg total) by mouth daily. Started by: Howard Pouch, DO   Ocuvite Adult 50+ Caps Take 1 capsule by mouth daily.   OCUVITE PO See admin instructions.   Shingrix injection Generic drug: Zoster Vaccine Adjuvanted   traZODone 100 MG tablet Commonly known as: DESYREL Take 1 tablet (100 mg total) by mouth at bedtime as needed for sleep.   valACYclovir 1000 MG tablet Commonly known as: VALTREX 2 tabs at onset, then repeat 2 tabs in 12 hours once.   VITAMIN B-12 PO Take 1,000 mg by mouth daily. Vitamin B12   Vitamin C 500 MG Caps Take 1 capsule by mouth daily.   Yuvafem 10 MCG Tabs vaginal tablet Generic drug: Estradiol Place 1 each vaginally 2 (two) times a week.   ZINC PO Take 1 tablet by mouth daily.       All past medical history, surgical history, allergies, family history, immunizations andmedications were updated in the EMR today and reviewed under the history and medication portions of their EMR.     ROS: Negative, with the exception of above mentioned in HPI   Objective:  BP 127/76   Pulse 93   Temp 98.6 F (37 C) (Oral)   Ht 5\' 6"  (1.676 m)   Wt 143 lb (64.9 kg)   LMP 11/22/2003 (Approximate)   SpO2 99%   BMI 23.08 kg/m  Body mass index is 23.08 kg/m. Gen: Afebrile. No acute distress. Nontoxic in appearance, well developed, well nourished.  HENT: AT. [place].  Eyes:Pupils Equal Round Reactive to light, Extraocular movements  intact,  Conjunctiva without redness, discharge or icterus. Neck/lymp/endocrine: Supple,no lymphadenopathy CV: RRR no murmur, no edema Chest: CTAB, no wheeze or crackles. Good air movement, normal resp effort.  Neuro: Normal gait. PERLA. EOMi. Alert. Oriented x3 Psych: Normal affect, dress and demeanor. Normal speech. Normal thought content and judgment.  No exam data present No results found. No results found for this or any previous visit (from the past 24 hour(s)).  Assessment/Plan: Madison Carr is a 69 y.o. female present for OV for  Prehypertension Start hctz 12.5 mg qd.  Monitor BP- today at goal. Has been elevated at home the last 4 weeks and here last appt.  Low sodium diet Exercise.  F/u q 5.5 mos with  other chronic conditions.    Reviewed expectations re: course of current medical issues.  Discussed self-management of symptoms.  Outlined signs and symptoms indicating need for more acute intervention.  Patient verbalized understanding and all questions were answered.  Patient received an After-Visit Summary.    Orders Placed This Encounter  Procedures  . HM COLONOSCOPY   Meds ordered this encounter  Medications  . hydrochlorothiazide (HYDRODIURIL) 12.5 MG tablet    Sig: Take 1 tablet (12.5 mg total) by mouth daily.    Dispense:  90 tablet    Refill:  1   Referral Orders  No referral(s) requested today     Note is dictated utilizing voice recognition software. Although note has been proof read prior to signing, occasional typographical errors still can be missed. If any questions arise, please do not hesitate to call for verification.   electronically signed by:  Howard Pouch, DO  [place]

## 2020-02-28 NOTE — Patient Instructions (Signed)
Low-Sodium Eating Plan Sodium, which is an element that makes up salt, helps you maintain a healthy balance of fluids in your body. Too much sodium can increase your blood pressure and cause fluid and waste to be held in your body. Your health care provider or dietitian may recommend following this plan if you have high blood pressure (hypertension), kidney disease, liver disease, or heart failure. Eating less sodium can help lower your blood pressure, reduce swelling, and protect your heart, liver, and kidneys. What are tips for following this plan? Reading food labels  The Nutrition Facts label lists the amount of sodium in one serving of the food. If you eat more than one serving, you must multiply the listed amount of sodium by the number of servings.  Choose foods with less than 140 mg of sodium per serving.  Avoid foods with 300 mg of sodium or more per serving. Shopping  Look for lower-sodium products, often labeled as "low-sodium" or "no salt added."  Always check the sodium content, even if foods are labeled as "unsalted" or "no salt added."  Buy fresh foods. ? Avoid canned foods and pre-made or frozen meals. ? Avoid canned, cured, or processed meats.  Buy breads that have less than 80 mg of sodium per slice.   Cooking  Eat more home-cooked food and less restaurant, buffet, and fast food.  Avoid adding salt when cooking. Use salt-free seasonings or herbs instead of table salt or sea salt. Check with your health care provider or pharmacist before using salt substitutes.  Cook with plant-based oils, such as canola, sunflower, or olive oil.   Meal planning  When eating at a restaurant, ask that your food be prepared with less salt or no salt, if possible. Avoid dishes labeled as brined, pickled, cured, smoked, or made with soy sauce, miso, or teriyaki sauce.  Avoid foods that contain MSG (monosodium glutamate). MSG is sometimes added to Chinese food, bouillon, and some canned  foods.  Make meals that can be grilled, baked, poached, roasted, or steamed. These are generally made with less sodium. General information Most people on this plan should limit their sodium intake to 1,500-2,000 mg (milligrams) of sodium each day. What foods should I eat? Fruits Fresh, frozen, or canned fruit. Fruit juice. Vegetables Fresh or frozen vegetables. "No salt added" canned vegetables. "No salt added" tomato sauce and paste. Low-sodium or reduced-sodium tomato and vegetable juice. Grains Low-sodium cereals, including oats, puffed wheat and rice, and shredded wheat. Low-sodium crackers. Unsalted rice. Unsalted pasta. Low-sodium bread. Whole-grain breads and whole-grain pasta. Meats and other proteins Fresh or frozen (no salt added) meat, poultry, seafood, and fish. Low-sodium canned tuna and salmon. Unsalted nuts. Dried peas, beans, and lentils without added salt. Unsalted canned beans. Eggs. Unsalted nut butters. Dairy Milk. Soy milk. Cheese that is naturally low in sodium, such as ricotta cheese, fresh mozzarella, or Swiss cheese. Low-sodium or reduced-sodium cheese. Cream cheese. Yogurt. Seasonings and condiments Fresh and dried herbs and spices. Salt-free seasonings. Low-sodium mustard and ketchup. Sodium-free salad dressing. Sodium-free light mayonnaise. Fresh or refrigerated horseradish. Lemon juice. Vinegar. Other foods Homemade, reduced-sodium, or low-sodium soups. Unsalted popcorn and pretzels. Low-salt or salt-free chips. The items listed above may not be a complete list of foods and beverages you can eat. Contact a dietitian for more information. What foods should I avoid? Vegetables Sauerkraut, pickled vegetables, and relishes. Olives. French fries. Onion rings. Regular canned vegetables (not low-sodium or reduced-sodium). Regular canned tomato sauce and paste (not low-sodium   or reduced-sodium). Regular tomato and vegetable juice (not low-sodium or reduced-sodium). Frozen  vegetables in sauces. Grains Instant hot cereals. Bread stuffing, pancake, and biscuit mixes. Croutons. Seasoned rice or pasta mixes. Noodle soup cups. Boxed or frozen macaroni and cheese. Regular salted crackers. Self-rising flour. Meats and other proteins Meat or fish that is salted, canned, smoked, spiced, or pickled. Precooked or cured meat, such as sausages or meat loaves. Bacon. Ham. Pepperoni. Hot dogs. Corned beef. Chipped beef. Salt pork. Jerky. Pickled herring. Anchovies and sardines. Regular canned tuna. Salted nuts. Dairy Processed cheese and cheese spreads. Hard cheeses. Cheese curds. Blue cheese. Feta cheese. String cheese. Regular cottage cheese. Buttermilk. Canned milk. Fats and oils Salted butter. Regular margarine. Ghee. Bacon fat. Seasonings and condiments Onion salt, garlic salt, seasoned salt, table salt, and sea salt. Canned and packaged gravies. Worcestershire sauce. Tartar sauce. Barbecue sauce. Teriyaki sauce. Soy sauce, including reduced-sodium. Steak sauce. Fish sauce. Oyster sauce. Cocktail sauce. Horseradish that you find on the shelf. Regular ketchup and mustard. Meat flavorings and tenderizers. Bouillon cubes. Hot sauce. Pre-made or packaged marinades. Pre-made or packaged taco seasonings. Relishes. Regular salad dressings. Salsa. Other foods Salted popcorn and pretzels. Corn chips and puffs. Potato and tortilla chips. Canned or dried soups. Pizza. Frozen entrees and pot pies. The items listed above may not be a complete list of foods and beverages you should avoid. Contact a dietitian for more information. Summary  Eating less sodium can help lower your blood pressure, reduce swelling, and protect your heart, liver, and kidneys.  Most people on this plan should limit their sodium intake to 1,500-2,000 mg (milligrams) of sodium each day.  Canned, boxed, and frozen foods are high in sodium. Restaurant foods, fast foods, and pizza are also very high in sodium. You  also get sodium by adding salt to food.  Try to cook at home, eat more fresh fruits and vegetables, and eat less fast food and canned, processed, or prepared foods. This information is not intended to replace advice given to you by your health care provider. Make sure you discuss any questions you have with your health care provider. Document Revised: 02/12/2019 Document Reviewed: 12/09/2018 Elsevier Patient Education  2021 Elsevier Inc.  

## 2020-03-20 ENCOUNTER — Other Ambulatory Visit: Payer: Self-pay | Admitting: Obstetrics & Gynecology

## 2020-03-20 ENCOUNTER — Ambulatory Visit: Payer: Medicare HMO

## 2020-03-20 DIAGNOSIS — Z01419 Encounter for gynecological examination (general) (routine) without abnormal findings: Secondary | ICD-10-CM

## 2020-03-21 NOTE — Telephone Encounter (Signed)
Pt needs AEX appt.  I just did her refill for Yuvafem.  Can you let her know?  Thanks.

## 2020-03-24 ENCOUNTER — Ambulatory Visit: Payer: Medicare HMO

## 2020-04-11 ENCOUNTER — Telehealth: Payer: Self-pay | Admitting: Family Medicine

## 2020-04-11 NOTE — Telephone Encounter (Signed)
Spoke with patient she stated she will call back to scheduled AWV. Caled patient to schedule Annual Wellness Visit.  Please schedule with Nurse Health Advisor Caroleen Hamman, RN at [facility]

## 2020-04-30 ENCOUNTER — Other Ambulatory Visit: Payer: Self-pay | Admitting: Obstetrics & Gynecology

## 2020-04-30 DIAGNOSIS — Z01419 Encounter for gynecological examination (general) (routine) without abnormal findings: Secondary | ICD-10-CM

## 2020-05-01 DIAGNOSIS — L821 Other seborrheic keratosis: Secondary | ICD-10-CM | POA: Diagnosis not present

## 2020-05-01 DIAGNOSIS — Z85828 Personal history of other malignant neoplasm of skin: Secondary | ICD-10-CM | POA: Diagnosis not present

## 2020-05-01 DIAGNOSIS — L820 Inflamed seborrheic keratosis: Secondary | ICD-10-CM | POA: Diagnosis not present

## 2020-05-01 DIAGNOSIS — L570 Actinic keratosis: Secondary | ICD-10-CM | POA: Diagnosis not present

## 2020-05-01 DIAGNOSIS — D225 Melanocytic nevi of trunk: Secondary | ICD-10-CM | POA: Diagnosis not present

## 2020-05-01 DIAGNOSIS — L814 Other melanin hyperpigmentation: Secondary | ICD-10-CM | POA: Diagnosis not present

## 2020-05-01 DIAGNOSIS — D2272 Melanocytic nevi of left lower limb, including hip: Secondary | ICD-10-CM | POA: Diagnosis not present

## 2020-05-01 DIAGNOSIS — D1801 Hemangioma of skin and subcutaneous tissue: Secondary | ICD-10-CM | POA: Diagnosis not present

## 2020-05-02 ENCOUNTER — Encounter (HOSPITAL_BASED_OUTPATIENT_CLINIC_OR_DEPARTMENT_OTHER): Payer: Self-pay

## 2020-05-11 ENCOUNTER — Ambulatory Visit: Payer: Medicare HMO

## 2020-05-12 ENCOUNTER — Other Ambulatory Visit: Payer: Self-pay

## 2020-05-12 ENCOUNTER — Ambulatory Visit
Admission: RE | Admit: 2020-05-12 | Discharge: 2020-05-12 | Disposition: A | Discharge status: Home / Self Care | Payer: Medicare HMO | Source: Ambulatory Visit | Attending: Family Medicine | Admitting: Family Medicine

## 2020-05-12 DIAGNOSIS — Z1231 Encounter for screening mammogram for malignant neoplasm of breast: Secondary | ICD-10-CM | POA: Diagnosis not present

## 2020-05-15 ENCOUNTER — Other Ambulatory Visit: Payer: Self-pay

## 2020-05-15 ENCOUNTER — Ambulatory Visit (INDEPENDENT_AMBULATORY_CARE_PROVIDER_SITE_OTHER): Payer: Medicare HMO | Admitting: Obstetrics & Gynecology

## 2020-05-15 ENCOUNTER — Other Ambulatory Visit (HOSPITAL_COMMUNITY)
Admission: RE | Admit: 2020-05-15 | Discharge: 2020-05-15 | Disposition: A | Discharge status: Home / Self Care | Payer: Medicare HMO | Source: Ambulatory Visit | Attending: Obstetrics & Gynecology | Admitting: Obstetrics & Gynecology

## 2020-05-15 ENCOUNTER — Encounter (HOSPITAL_BASED_OUTPATIENT_CLINIC_OR_DEPARTMENT_OTHER): Payer: Self-pay | Admitting: Obstetrics & Gynecology

## 2020-05-15 VITALS — BP 138/69 | HR 77 | Ht 66.25 in | Wt 138.0 lb

## 2020-05-15 DIAGNOSIS — F5101 Primary insomnia: Secondary | ICD-10-CM

## 2020-05-15 DIAGNOSIS — M8580 Other specified disorders of bone density and structure, unspecified site: Secondary | ICD-10-CM

## 2020-05-15 DIAGNOSIS — Z780 Asymptomatic menopausal state: Secondary | ICD-10-CM | POA: Diagnosis not present

## 2020-05-15 DIAGNOSIS — N952 Postmenopausal atrophic vaginitis: Secondary | ICD-10-CM

## 2020-05-15 DIAGNOSIS — Z01419 Encounter for gynecological examination (general) (routine) without abnormal findings: Secondary | ICD-10-CM

## 2020-05-15 DIAGNOSIS — Z1151 Encounter for screening for human papillomavirus (HPV): Secondary | ICD-10-CM | POA: Diagnosis not present

## 2020-05-15 DIAGNOSIS — B009 Herpesviral infection, unspecified: Secondary | ICD-10-CM | POA: Diagnosis not present

## 2020-05-15 DIAGNOSIS — Z124 Encounter for screening for malignant neoplasm of cervix: Secondary | ICD-10-CM | POA: Diagnosis not present

## 2020-05-15 DIAGNOSIS — R87612 Low grade squamous intraepithelial lesion on cytologic smear of cervix (LGSIL): Secondary | ICD-10-CM | POA: Diagnosis not present

## 2020-05-15 DIAGNOSIS — Z9189 Other specified personal risk factors, not elsewhere classified: Secondary | ICD-10-CM

## 2020-05-15 DIAGNOSIS — R69 Illness, unspecified: Secondary | ICD-10-CM | POA: Diagnosis not present

## 2020-05-15 MED ORDER — ESTRADIOL 10 MCG VA TABS
1.0000 | ORAL_TABLET | VAGINAL | 4 refills | Status: DC
Start: 2020-05-15 — End: 2021-09-04

## 2020-05-15 NOTE — Progress Notes (Signed)
70 y.o. [id number] Married White or Caucasian female here for breast and pelvic exam. Doing well. Denies vaginal bleeding.  Patient's last menstrual period was 11/22/2003 (approximate).          Sexually active: Yes.    The current method of family planning is post menopausal status.    Exercising: Yes.    Smoker:  no  Health Maintenance: Pap:  today History of abnormal Pap:  Yes with h/o of colposcopy x 2 MMG:  04/2020 Colonoscopy:  2020, three year follow up BMD:   03/2018, -1.8 TDaP:  2016 Pneumonia vaccine(s):  07/16/2019 Shingrix:   completed Hep C testing: 10/09/2015 Screening Labs: done with Dr. Raoul Pitch   reports that she has never smoked. She has never used smokeless tobacco. She reports current alcohol use of about 5.0 standard drinks of alcohol per week. She reports that she does not use drugs.  Past Medical History:  Diagnosis Date  . BCC (basal cell carcinoma of skin)    Right [facility]) (per patient done years ago)  . 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 Fibromyalgia   . History of hemorrhoidectomy 01/27/2019  . Insomnia   . Paroxysmal tachycardia ([place])   . Postmenopausal atrophic vaginitis 5/09  . Squamous cell carcinoma of skin 09/22/2019   in situ- mid frontal scalp (CX35FU)    Past Surgical History:  Procedure Laterality Date  . APPENDECTOMY  08/25/2015   With colectomy  . BASAL CELL CARCINOMA EXCISION  2008 ?   nose  . BLEPHAROPLASTY  2004  . COLPOSCOPY W/ BIOPSY / CURETTAGE  07/11/2008   benign with focal koilocytic atypia, followup remained ASCUS with neg. HR HPV through 2013  . ENDOMETRIAL ABLATION  2005  . HEMORROIDECTOMY  01/27/2019  . LAPAROSCOPIC RIGHT COLECTOMY N/A 08/25/2015   Procedure: Diagnostic LAPAROSCOPY;  Surgeon: Jackolyn Confer, MD;  Location: [facility];  Service: General;  Laterality: N/A;  . PARTIAL COLECTOMY  08/25/2015   Procedure: Extended right COLECTOMY;  Surgeon: Jackolyn Confer, MD;  Location: [facility];  Service: General;;  . [place]  . TONSILLECTOMY AND ADENOIDECTOMY  1968    Current Outpatient Medications  Medication Sig Dispense Refill  . Ascorbic Acid (VITAMIN C) 500 MG CAPS Take 1 capsule by mouth daily.    . Calcium Carbonate-Vitamin D3 600-400 MG-UNIT TABS 1 tablet with food    . CALCIUM-MAGNESIUM-ZINC PO Take by mouth. Calcium 1000mg  Magnesium 400mg   Zinc 25mg     . cholecalciferol (VITAMIN D) 1000 UNITS tablet Take 1,000 Units by mouth daily.    . Coenzyme Q10 50 MG CAPS Take by mouth.    . Cyanocobalamin (VITAMIN B-12 PO) Take 1,000 mg by mouth daily. Vitamin B12    . hydrochlorothiazide (HYDRODIURIL) 12.5 MG tablet Take 1 tablet (12.5 mg total) by mouth daily. 90 tablet 1  . Misc Natural Products (GLUCOSAMINE CHONDROITIN ADV PO) Take by mouth daily.    . Misc Natural Products (OSTEO BI-FLEX TRIPLE STRENGTH PO) See admin instructions.    . Multiple Vitamins-Minerals (OCUVITE ADULT 50+) CAPS Take 1 capsule by mouth daily.    . Multiple Vitamins-Minerals (OCUVITE PO) See admin instructions.    . Multiple Vitamins-Minerals (ZINC PO) Take 1 tablet by mouth daily.    Marland Kitchen SHINGRIX injection     . traZODone (DESYREL) 100 MG tablet Take 1 tablet (100 mg total) by mouth at bedtime as needed for sleep. 90 tablet 1  . valACYclovir (VALTREX) 1000 MG tablet 2 tabs at onset, then  repeat 2 tabs in 12 hours once. 20 tablet 1  . YUVAFEM 10 MCG TABS vaginal tablet 1 TABLET PV TWICE WEEKLY 24 tablet 1  . fluorouracil (EFUDEX) 5 % cream Apply 1 application topically 2 (two) times daily. (Patient not taking: Reported on 05/15/2020)     No current facility-administered medications for this visit.    Family History  Problem Relation Age of Onset  . Melanoma Mother 25       left leg  . Arthritis Mother   . Miscarriages / [place] Mother   . Heart disease Father   . Macular degeneration Father   . Macular degeneration Paternal Aunt   . Macular degeneration Paternal Uncle   . Cancer Paternal Grandmother   .  Arthritis Son   . Healthy Daughter   . Breast cancer Neg Hx     Review of Systems  All other systems reviewed and are negative.   Exam:   BP 138/69   Pulse 77   Ht 5' 6.25" (1.683 m)   Wt 138 lb (62.6 kg)   LMP 11/22/2003 (Approximate)   BMI 22.11 kg/m   Height: 5' 6.25" (168.3 cm)  General appearance: alert, cooperative and appears stated age Head: Normocephalic, without obvious abnormality, atraumatic Lungs: clear to auscultation bilaterally Breasts: normal appearance, no masses or tenderness Abdomen: soft, non-tender; bowel sounds normal; no masses,  no organomegaly Extremities: extremities normal, atraumatic, no cyanosis or edema Skin: Skin color, texture, turgor normal. No rashes or lesions Lymph nodes: Cervical, supraclavicular, and axillary nodes normal. No abnormal inguinal nodes palpated Neurologic: Grossly normal  Pelvic: External genitalia:  no lesions              Urethra:  normal appearing urethra with no masses, tenderness or lesions              Bartholins and Skenes: normal                 Vagina: normal appearing vagina with normal color and no discharge, no lesions              Cervix: no lesions              Pap taken: Yes.   Bimanual Exam:  Uterus:  normal size, contour, position, consistency, mobility, non-tender              Adnexa: normal adnexa and no mass, fullness, tenderness               Rectovaginal: Confirms               Anus:  normal sphincter tone, no lesions  Chaperone, Prince Rome, CMA, was present for exam.  Assessment/Plan: 1. GYN exam for high-risk Medicare patient - pap smear obtained today - MMG 04/2020 - BMD 03/2018, t score -1.8 - colonoscopy 2020, follow up due next year - vaccines updated - lab work done with Dr. 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 Vaginal atrophy - Estradiol (YUVAFEM) 10 MCG TABS vaginal tablet; Place 1 tablet (10 mcg total) vaginally 2 (two) times a week.  Dispense: 24 tablet;  Refill: 4  4. Postmenopausal - off HRT  5. Primary insomnia  6. Osteopenia, unspecified location

## 2020-05-17 DIAGNOSIS — Z01419 Encounter for gynecological examination (general) (routine) without abnormal findings: Secondary | ICD-10-CM | POA: Insufficient documentation

## 2020-05-17 DIAGNOSIS — M8580 Other specified disorders of bone density and structure, unspecified site: Secondary | ICD-10-CM | POA: Insufficient documentation

## 2020-05-17 DIAGNOSIS — N952 Postmenopausal atrophic vaginitis: Secondary | ICD-10-CM | POA: Insufficient documentation

## 2020-05-17 DIAGNOSIS — Z780 Asymptomatic menopausal state: Secondary | ICD-10-CM | POA: Insufficient documentation

## 2020-05-24 DIAGNOSIS — M1711 Unilateral primary osteoarthritis, right knee: Secondary | ICD-10-CM | POA: Diagnosis not present

## 2020-05-24 DIAGNOSIS — H524 Presbyopia: Secondary | ICD-10-CM | POA: Diagnosis not present

## 2020-05-24 DIAGNOSIS — H2513 Age-related nuclear cataract, bilateral: Secondary | ICD-10-CM | POA: Diagnosis not present

## 2020-05-24 DIAGNOSIS — H52203 Unspecified astigmatism, bilateral: Secondary | ICD-10-CM | POA: Diagnosis not present

## 2020-05-24 DIAGNOSIS — H04123 Dry eye syndrome of bilateral lacrimal glands: Secondary | ICD-10-CM | POA: Diagnosis not present

## 2020-05-25 LAB — CYTOLOGY - PAP
Comment: NEGATIVE
High risk HPV: NEGATIVE

## 2020-05-30 DIAGNOSIS — U071 COVID-19: Secondary | ICD-10-CM | POA: Diagnosis not present

## 2020-07-04 ENCOUNTER — Encounter (HOSPITAL_BASED_OUTPATIENT_CLINIC_OR_DEPARTMENT_OTHER): Payer: Self-pay | Admitting: Obstetrics & Gynecology

## 2020-07-04 ENCOUNTER — Ambulatory Visit (INDEPENDENT_AMBULATORY_CARE_PROVIDER_SITE_OTHER): Payer: Medicare HMO | Admitting: Obstetrics & Gynecology

## 2020-07-04 ENCOUNTER — Other Ambulatory Visit (HOSPITAL_COMMUNITY)
Admission: RE | Admit: 2020-07-04 | Discharge: 2020-07-04 | Disposition: A | Discharge status: Home / Self Care | Payer: Medicare HMO | Source: Ambulatory Visit | Attending: Obstetrics & Gynecology | Admitting: Obstetrics & Gynecology

## 2020-07-04 ENCOUNTER — Other Ambulatory Visit: Payer: Self-pay

## 2020-07-04 VITALS — BP 142/62 | HR 77 | Wt 137.0 lb

## 2020-07-04 DIAGNOSIS — R87612 Low grade squamous intraepithelial lesion on cytologic smear of cervix (LGSIL): Secondary | ICD-10-CM

## 2020-07-04 DIAGNOSIS — N870 Mild cervical dysplasia: Secondary | ICD-10-CM | POA: Diagnosis not present

## 2020-07-05 DIAGNOSIS — M1711 Unilateral primary osteoarthritis, right knee: Secondary | ICD-10-CM | POA: Diagnosis not present

## 2020-07-06 DIAGNOSIS — L814 Other melanin hyperpigmentation: Secondary | ICD-10-CM | POA: Diagnosis not present

## 2020-07-06 DIAGNOSIS — B078 Other viral warts: Secondary | ICD-10-CM | POA: Diagnosis not present

## 2020-07-06 DIAGNOSIS — L570 Actinic keratosis: Secondary | ICD-10-CM | POA: Diagnosis not present

## 2020-07-06 DIAGNOSIS — D044 Carcinoma in situ of skin of scalp and neck: Secondary | ICD-10-CM | POA: Diagnosis not present

## 2020-07-06 LAB — SURGICAL PATHOLOGY

## 2020-07-06 NOTE — Progress Notes (Signed)
70 y.o. Married White female here for colposcopy with possible biopsies and/or ECC due to LGSIL pap with neg HR HPV Pap obtained 05/15/2020.    Prior evaluation/treatment:  none.  Patient's last menstrual period was 11/22/2003 (approximate).          Sexually active: Yes.    The current method of family planning is post menopausal status.     Patient has been counseled about results and procedure.  Risks and benefits have bene reviewed including immediate and/or delayed bleeding, infection, cervical scaring from procedure, possibility of needing additional follow up as well as treatment.  rare risks of missing a lesion discussed as well.  All questions answered.  Pt ready to proceed.  BP (!) 142/62   Pulse 77   Wt 137 lb (62.1 kg)   LMP 11/22/2003 (Approximate)   BMI 21.95 kg/m   Physical Exam Constitutional:      Appearance: Normal appearance.  Genitourinary:    General: Normal vulva.     Vagina: Normal.     Neurological:     General: No focal deficit present.     Mental Status: She is alert.  Psychiatric:        Mood and Affect: Mood normal.    Speculum placed.  3% acetic acid applied to cervix for >45 seconds.  Cervix visualized with both 7.5X and 15X magnification.  Green filter also used.  Lugols solution was used.  Findings:  small area of decreased staining at 9 o'clock.  Biopsy:  was obtained at this location.  ECC:  was performed.  Monsel's was needed.  Excellent hemostasis was present.  Pt tolerated procedure well and all instruments were removed.  Findings noted above on picture of cervix.  Chaperone, Prince Rome, CMA, was present during procedure.  Assessment/Plan:  1. LGSIL on Pap smear of cervix - results and follow up will be called to pt - Surgical pathology( [place])

## 2020-07-07 DIAGNOSIS — G4700 Insomnia, unspecified: Secondary | ICD-10-CM | POA: Diagnosis not present

## 2020-07-12 DIAGNOSIS — M1711 Unilateral primary osteoarthritis, right knee: Secondary | ICD-10-CM | POA: Diagnosis not present

## 2020-07-17 ENCOUNTER — Ambulatory Visit: Payer: Medicare HMO | Admitting: Family Medicine

## 2020-07-18 ENCOUNTER — Other Ambulatory Visit: Payer: Self-pay | Admitting: Family Medicine

## 2020-07-19 ENCOUNTER — Encounter (HOSPITAL_BASED_OUTPATIENT_CLINIC_OR_DEPARTMENT_OTHER): Payer: Self-pay | Admitting: Obstetrics & Gynecology

## 2020-07-19 ENCOUNTER — Other Ambulatory Visit (HOSPITAL_COMMUNITY)
Admission: RE | Admit: 2020-07-19 | Discharge: 2020-07-19 | Disposition: A | Discharge status: Home / Self Care | Payer: Medicare HMO | Source: Ambulatory Visit | Attending: Obstetrics & Gynecology | Admitting: Obstetrics & Gynecology

## 2020-07-19 ENCOUNTER — Other Ambulatory Visit: Payer: Self-pay

## 2020-07-19 ENCOUNTER — Ambulatory Visit (HOSPITAL_BASED_OUTPATIENT_CLINIC_OR_DEPARTMENT_OTHER): Payer: Medicare HMO | Admitting: Obstetrics & Gynecology

## 2020-07-19 VITALS — BP 136/67 | HR 75 | Ht 67.0 in | Wt 140.2 lb

## 2020-07-19 DIAGNOSIS — N870 Mild cervical dysplasia: Secondary | ICD-10-CM | POA: Diagnosis not present

## 2020-07-19 DIAGNOSIS — M1711 Unilateral primary osteoarthritis, right knee: Secondary | ICD-10-CM | POA: Diagnosis not present

## 2020-07-19 NOTE — Progress Notes (Signed)
70 y.o. G3P2 Married Not Hispanic or Latino female here for LEEP due to CIN1 noted with biopsy at colposcopy performed 07/04/2020.  Pap obtained before colposcopy showed LGSIL findings with negative HR HPV but glandular atypia that may involve dysplasia.    Patient's last menstrual period was 11/22/2003 (approximate).          Sexually active: Yes.    The current method of family planning is post menopausal status.     Pre-procedure vitals: Blood pressure 136/67, pulse 75, height 5\' 7"  (1.702 m), weight 140 lb 3.2 oz (63.6 kg), last menstrual period 11/22/2003.   Procedure explained and patient's questions were invited and answered.   Consent form signed.  Procedure Set-up: Grounding pad located right thigh.  Cautery settings: 35 cut/35 coagulation.  Suction applied to coated speculum.  Procedure:  Speculum placed with good visualization of the cervix.  Colposcopy performed showing:  no visible lesions.  Cervix anesthetized using 2% Xylocaine with 1:100,000units Epinephrine.  6 cc's used.  Entire transition zone excised with 12 x 77mm loop in 1 pass.  Specimen(s) placed on cork and labeled for pathology.  ECC obtained above LEEP specimen.  Hemostasis obtained with ball cautery and Monsel's solution.  EBL:  Minimal  Complications:  none  Patient tolerated procedure well and left the office in satisfactory condition.  Assessment/Plan: 1. Dysplasia of cervix, low grade (CIN 1) - Surgical pathology( [place]/ [facility]) - follow up will be planned pending results.

## 2020-07-19 NOTE — Patient Instructions (Signed)

## 2020-07-21 LAB — SURGICAL PATHOLOGY

## 2020-07-26 ENCOUNTER — Telehealth (HOSPITAL_BASED_OUTPATIENT_CLINIC_OR_DEPARTMENT_OTHER): Payer: Self-pay

## 2020-07-26 ENCOUNTER — Encounter (HOSPITAL_BASED_OUTPATIENT_CLINIC_OR_DEPARTMENT_OTHER): Payer: Self-pay | Admitting: Obstetrics & Gynecology

## 2020-07-26 ENCOUNTER — Other Ambulatory Visit: Payer: Self-pay

## 2020-07-26 ENCOUNTER — Ambulatory Visit (HOSPITAL_BASED_OUTPATIENT_CLINIC_OR_DEPARTMENT_OTHER): Payer: Medicare HMO | Admitting: Obstetrics & Gynecology

## 2020-07-26 VITALS — BP 132/76 | HR 87 | Wt 138.0 lb

## 2020-07-26 DIAGNOSIS — Z9889 Other specified postprocedural states: Secondary | ICD-10-CM

## 2020-07-26 DIAGNOSIS — N870 Mild cervical dysplasia: Secondary | ICD-10-CM

## 2020-07-26 NOTE — Telephone Encounter (Signed)
Patient called today. She would like to speak with Dr. Sabra Heck about her pathology results. tbw

## 2020-08-01 NOTE — Progress Notes (Signed)
GYNECOLOGY  VISIT  CC:   bleeding after LEEP  HPI: 70 y.o. G72P2012 Married White or Caucasian female here for complaint of some bright red bleeding that occurred over the last 24 hours.  Pt reports she has been playing pickle ball and wonders if this is related.  Bleeding is not heavy but is bright red.  She is not having any cramping at this time.  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  Pathology discussed.  Will plan repeat pap smear in 6 months.    GYNECOLOGIC HISTORY: Patient's last menstrual period was 11/22/2003 (approximate).  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   Dry eyes 09/19/2019   Anxiety 09/19/2019   Paroxysmal tachycardia ([place]) 09/24/2018   Primary insomnia 11/30/2016   Hx of adenomatous colonic polyps 08/25/2015    Past Medical History:  Diagnosis Date   BCC (basal cell carcinoma of skin)    Right [facility]) (per patient done years ago)   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   nose   BLEPHAROPLASTY  2004   COLPOSCOPY W/ BIOPSY / CURETTAGE  07/11/2008   benign with focal koilocytic atypia, followup remained ASCUS with neg. HR HPV through 2013   ENDOMETRIAL ABLATION  2005   HEMORROIDECTOMY  01/27/2019   LAPAROSCOPIC RIGHT COLECTOMY N/A 08/25/2015   Procedure: Diagnostic LAPAROSCOPY;   Surgeon: Jackolyn Confer, MD;  Location: [facility];  Service: General;  Laterality: N/A;   PARTIAL COLECTOMY  08/25/2015   Procedure: Extended right COLECTOMY;  Surgeon: Jackolyn Confer, MD;  Location: [facility];  Service: General;;   [place]    MEDS:   Current Outpatient Medications on File Prior to Visit  Medication Sig Dispense Refill   Ascorbic Acid (VITAMIN C) 500 MG CAPS Take 1 capsule by mouth daily.     Calcium Carbonate-Vitamin D3 600-400 MG-UNIT TABS 1 tablet with food     CALCIUM-MAGNESIUM-ZINC PO Take by mouth. Calcium 1000mg  Magnesium 400mg   Zinc 25mg      cholecalciferol (VITAMIN D) 1000 UNITS tablet Take 1,000 Units by mouth daily.     Coenzyme Q10 50 MG CAPS Take by mouth.     Cyanocobalamin (VITAMIN B-12 PO) Take 1,000 mg by mouth daily. Vitamin B12     Estradiol (YUVAFEM) 10 MCG TABS vaginal tablet Place 1 tablet (10 mcg total) vaginally 2 (two) times a week. 24 tablet 4   fluorouracil (EFUDEX) 5 % cream Apply 1 application topically 2 (two) times daily. (Patient not taking: Reported on 07/19/2020)     hydrochlorothiazide (HYDRODIURIL) 12.5 MG tablet Take 1 tablet (12.5 mg total) by mouth daily. 90 tablet 1   LORazepam (ATIVAN) 1 MG tablet Take 1 mg by mouth every 8 (eight) hours as needed for anxiety.  Misc Natural Products (GLUCOSAMINE CHONDROITIN ADV PO) Take by mouth daily.     Misc Natural Products (OSTEO BI-FLEX TRIPLE STRENGTH PO) See admin instructions.     Multiple Vitamins-Minerals (OCUVITE ADULT 50+) CAPS Take 1 capsule by mouth daily.     Multiple Vitamins-Minerals (OCUVITE PO) See admin instructions.     Multiple Vitamins-Minerals (ZINC PO) Take 1 tablet by mouth daily.     SHINGRIX injection      traZODone (DESYREL) 100 MG tablet TAKE 1 TABLET BY MOUTH AT BEDTIME AS NEEDED FOR SLEEP. (Patient not taking: Reported on 07/19/2020) 30 tablet 0   valACYclovir (VALTREX) 1000 MG tablet 2 tabs at onset, then repeat  2 tabs in 12 hours once. 20 tablet 1   No current facility-administered medications on file prior to visit.    ALLERGIES: Patient has no known allergies.  Family History  Problem Relation Age of Onset   Melanoma Mother 26       left leg   Arthritis Mother    Miscarriages / [place] Mother    Heart disease Father    Macular degeneration Father    Macular degeneration Paternal Aunt    Macular degeneration Paternal Uncle    Cancer Paternal Grandmother    Arthritis Son    Healthy Daughter    Breast cancer Neg Hx     SH:  married, non smoker  Review of Systems  Respiratory: Negative.    Cardiovascular: Negative.   Genitourinary:  Positive for vaginal bleeding. Negative for pelvic pain.   PHYSICAL EXAMINATION:    BP 132/76   Pulse 87   Wt 138 lb (62.6 kg)   LMP 11/22/2003 (Approximate)   BMI 21.61 kg/m     General appearance: alert, cooperative and appears stated age Lymph:  no inguinal LAD noted  Pelvic: External genitalia:  no lesions              Urethra:  normal appearing urethra with no masses, tenderness or lesions              Bartholins and Skenes: normal                 Vagina: normal appearing vagina with normal color and discharge, no lesions              Cervix:  s/p LEEP with mild oozing of blood from cone bed.  Monsel's solution and pressure applied until there was good hemostasis present.   Pt tolerated this well.  Chaperone, Octaviano Batty, CMA, was present for exam.  Assessment/Plan: 1. Dysplasia of cervix, low grade (CIN 1)  2. H/O LEEP - precautions given for calling if has continued bleeding.  Number provided for after hours and weekends.  Recommended no lifting, no tennis or pickleball and nothing in vagina.  Will recheck 2 weeks. - will need to plan follow up pap smear in 6 months as well.

## 2020-08-09 ENCOUNTER — Encounter (HOSPITAL_BASED_OUTPATIENT_CLINIC_OR_DEPARTMENT_OTHER): Payer: Self-pay | Admitting: Obstetrics & Gynecology

## 2020-08-09 ENCOUNTER — Other Ambulatory Visit: Payer: Self-pay

## 2020-08-09 ENCOUNTER — Ambulatory Visit (HOSPITAL_BASED_OUTPATIENT_CLINIC_OR_DEPARTMENT_OTHER): Payer: Medicare HMO | Admitting: Obstetrics & Gynecology

## 2020-08-09 VITALS — BP 127/79 | HR 84 | Ht 67.0 in | Wt 138.6 lb

## 2020-08-09 DIAGNOSIS — N99820 Postprocedural hemorrhage and hematoma of a genitourinary system organ or structure following a genitourinary system procedure: Secondary | ICD-10-CM

## 2020-08-09 DIAGNOSIS — Z9889 Other specified postprocedural states: Secondary | ICD-10-CM

## 2020-08-10 NOTE — Progress Notes (Signed)
GYNECOLOGY  VISIT  CC:   recheck after bleeding from LEEP  HPI: 70 y.o. G42P2012 Married White or Caucasian female here for recheck after having post procedure bleeding.  Reports having a little watery discharge that looks a little like old blood.  No active red bleeding.  No other discharge or odor.  Feels fine.  Going to the beach next weekend.Madison Carr  GYNECOLOGIC HISTORY: Patient's last menstrual period was 11/22/2003 (approximate). 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   Dry eyes 09/19/2019   Anxiety 09/19/2019   Paroxysmal tachycardia ([place]) 09/24/2018   Primary insomnia 11/30/2016   Hx of adenomatous colonic polyps 08/25/2015    Past Medical History:  Diagnosis Date   BCC (basal cell carcinoma of skin)    Right Inner Cheek [facility]) (per patient done years ago)   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   nose   BLEPHAROPLASTY  2004   COLPOSCOPY W/ BIOPSY / CURETTAGE  07/11/2008   benign with focal koilocytic atypia, followup remained ASCUS with neg. HR HPV through 2013   ENDOMETRIAL ABLATION  2005   HEMORROIDECTOMY  01/27/2019   LAPAROSCOPIC RIGHT COLECTOMY N/A 08/25/2015   Procedure: Diagnostic LAPAROSCOPY;  Surgeon: Jackolyn Confer, MD;  [facility];  Service: General;  Laterality: N/A;   PARTIAL COLECTOMY  08/25/2015   Procedure: Extended right COLECTOMY;  Surgeon: Jackolyn Confer,  MD;  Location: [facility];  Service: General;;   [place]    MEDS:   Current Outpatient Medications on File Prior to Visit  Medication Sig Dispense Refill   Ascorbic Acid (VITAMIN C) 500 MG CAPS Take 1 capsule by mouth daily.     Calcium Carbonate-Vitamin D3 600-400 MG-UNIT TABS 1 tablet with food     CALCIUM-MAGNESIUM-ZINC PO Take by mouth. Calcium 1000mg  Magnesium 400mg   Zinc 25mg      cholecalciferol (VITAMIN D) 1000 UNITS tablet Take 1,000 Units by mouth daily.     Coenzyme Q10 50 MG CAPS Take by mouth.     Cyanocobalamin (VITAMIN B-12 PO) Take 1,000 mg by mouth daily. Vitamin B12     Estradiol (YUVAFEM) 10 MCG TABS vaginal tablet Place 1 tablet (10 mcg total) vaginally 2 (two) times a week. 24 tablet 4   hydrochlorothiazide (HYDRODIURIL) 12.5 MG tablet Take 1 tablet (12.5 mg total) by mouth daily. 90 tablet 1   LORazepam (ATIVAN) 1 MG tablet Take 1 mg by mouth every 8 (eight) hours as needed for anxiety.     Misc Natural Products (GLUCOSAMINE CHONDROITIN ADV PO) Take by mouth daily.     Misc Natural Products (OSTEO BI-FLEX TRIPLE STRENGTH PO) See admin instructions.     Multiple Vitamins-Minerals (OCUVITE PO) See admin instructions.     Multiple Vitamins-Minerals (ZINC PO) Take 1 tablet by mouth daily.     SHINGRIX injection  valACYclovir (VALTREX) 1000 MG tablet 2 tabs at onset, then repeat 2 tabs in 12 hours once. 20 tablet 1   fluorouracil (EFUDEX) 5 % cream Apply 1 application topically 2 (two) times daily. (Patient not taking: No sig reported)     No current facility-administered medications on file prior to visit.    ALLERGIES: Patient has no known allergies.  Family History  Problem Relation Age of Onset   Melanoma Mother 71       left leg   Arthritis Mother    Miscarriages / [place] Mother    Heart disease Father    Macular degeneration Father    Macular degeneration Paternal Aunt    Macular degeneration  Paternal Uncle    Cancer Paternal Grandmother    Arthritis Son    Healthy Daughter    Breast cancer Neg Hx     SH:  married, non smoker  Review of Systems  Genitourinary: Negative.   All other systems reviewed and are negative.  PHYSICAL EXAMINATION:    BP 127/79   Pulse 84   Ht 5\' 7"  (1.702 m)   Wt 138 lb 9.6 oz (62.9 kg)   LMP 11/22/2003 (Approximate)   BMI 21.71 kg/m     General appearance: alert, cooperative and appears stated age  Pelvic: External genitalia:  no lesions              Urethra:  normal appearing urethra with no masses, tenderness or lesions              Bartholins and Skenes: normal                 Vagina: normal appearing vagina with normal color and discharge, no lesions              Cervix: no lesions and cone bed healing well, no active bleeding, small amt of watery discharge from cervix noted   Assessment/Plan: 1. H/O LEEP - repeat pap smear 6 months.  Appt scheduled.  2. Postoperative vaginal bleeding following genitourinary procedure - pt will not play tennis for another week and then is released to return to normal activity except no intercourse for 2 more weeks.

## 2020-08-13 ENCOUNTER — Other Ambulatory Visit: Payer: Self-pay | Admitting: Family Medicine

## 2020-08-31 DIAGNOSIS — Z79899 Other long term (current) drug therapy: Secondary | ICD-10-CM | POA: Diagnosis not present

## 2020-08-31 DIAGNOSIS — I10 Essential (primary) hypertension: Secondary | ICD-10-CM | POA: Diagnosis not present

## 2020-08-31 DIAGNOSIS — G4700 Insomnia, unspecified: Secondary | ICD-10-CM | POA: Diagnosis not present

## 2020-09-14 ENCOUNTER — Encounter (HOSPITAL_BASED_OUTPATIENT_CLINIC_OR_DEPARTMENT_OTHER): Payer: Self-pay

## 2020-10-05 DIAGNOSIS — L820 Inflamed seborrheic keratosis: Secondary | ICD-10-CM | POA: Diagnosis not present

## 2020-10-05 DIAGNOSIS — L570 Actinic keratosis: Secondary | ICD-10-CM | POA: Diagnosis not present

## 2020-10-31 DIAGNOSIS — R0981 Nasal congestion: Secondary | ICD-10-CM | POA: Diagnosis not present

## 2020-10-31 DIAGNOSIS — J209 Acute bronchitis, unspecified: Secondary | ICD-10-CM | POA: Diagnosis not present

## 2020-12-19 DIAGNOSIS — L570 Actinic keratosis: Secondary | ICD-10-CM | POA: Diagnosis not present

## 2020-12-19 DIAGNOSIS — L718 Other rosacea: Secondary | ICD-10-CM | POA: Diagnosis not present

## 2020-12-19 DIAGNOSIS — L821 Other seborrheic keratosis: Secondary | ICD-10-CM | POA: Diagnosis not present

## 2021-01-23 DIAGNOSIS — J019 Acute sinusitis, unspecified: Secondary | ICD-10-CM | POA: Diagnosis not present

## 2021-02-01 DIAGNOSIS — H16102 Unspecified superficial keratitis, left eye: Secondary | ICD-10-CM | POA: Diagnosis not present

## 2021-02-01 DIAGNOSIS — H16223 Keratoconjunctivitis sicca, not specified as Sjogren's, bilateral: Secondary | ICD-10-CM | POA: Diagnosis not present

## 2021-02-09 ENCOUNTER — Ambulatory Visit (HOSPITAL_BASED_OUTPATIENT_CLINIC_OR_DEPARTMENT_OTHER): Payer: Medicare HMO | Admitting: Obstetrics & Gynecology

## 2021-02-09 ENCOUNTER — Encounter (HOSPITAL_BASED_OUTPATIENT_CLINIC_OR_DEPARTMENT_OTHER): Payer: Self-pay

## 2021-02-12 DIAGNOSIS — Z79899 Other long term (current) drug therapy: Secondary | ICD-10-CM | POA: Diagnosis not present

## 2021-02-12 DIAGNOSIS — Z0000 Encounter for general adult medical examination without abnormal findings: Secondary | ICD-10-CM | POA: Diagnosis not present

## 2021-02-12 DIAGNOSIS — I10 Essential (primary) hypertension: Secondary | ICD-10-CM | POA: Diagnosis not present

## 2021-02-12 DIAGNOSIS — F5104 Psychophysiologic insomnia: Secondary | ICD-10-CM | POA: Diagnosis not present

## 2021-02-15 DIAGNOSIS — H16223 Keratoconjunctivitis sicca, not specified as Sjogren's, bilateral: Secondary | ICD-10-CM | POA: Diagnosis not present

## 2021-03-19 DIAGNOSIS — J0140 Acute pansinusitis, unspecified: Secondary | ICD-10-CM | POA: Diagnosis not present

## 2021-04-10 ENCOUNTER — Other Ambulatory Visit: Payer: Self-pay | Admitting: Physician Assistant

## 2021-04-10 DIAGNOSIS — Z1231 Encounter for screening mammogram for malignant neoplasm of breast: Secondary | ICD-10-CM

## 2021-05-08 DIAGNOSIS — D225 Melanocytic nevi of trunk: Secondary | ICD-10-CM | POA: Diagnosis not present

## 2021-05-08 DIAGNOSIS — L821 Other seborrheic keratosis: Secondary | ICD-10-CM | POA: Diagnosis not present

## 2021-05-08 DIAGNOSIS — L814 Other melanin hyperpigmentation: Secondary | ICD-10-CM | POA: Diagnosis not present

## 2021-05-08 DIAGNOSIS — D692 Other nonthrombocytopenic purpura: Secondary | ICD-10-CM | POA: Diagnosis not present

## 2021-05-08 DIAGNOSIS — L218 Other seborrheic dermatitis: Secondary | ICD-10-CM | POA: Diagnosis not present

## 2021-05-08 DIAGNOSIS — Z85828 Personal history of other malignant neoplasm of skin: Secondary | ICD-10-CM | POA: Diagnosis not present

## 2021-05-08 DIAGNOSIS — C4441 Basal cell carcinoma of skin of scalp and neck: Secondary | ICD-10-CM | POA: Diagnosis not present

## 2021-05-08 DIAGNOSIS — L570 Actinic keratosis: Secondary | ICD-10-CM | POA: Diagnosis not present

## 2021-05-08 DIAGNOSIS — D1801 Hemangioma of skin and subcutaneous tissue: Secondary | ICD-10-CM | POA: Diagnosis not present

## 2021-05-22 DIAGNOSIS — R630 Anorexia: Secondary | ICD-10-CM | POA: Diagnosis not present

## 2021-05-22 DIAGNOSIS — R410 Disorientation, unspecified: Secondary | ICD-10-CM | POA: Diagnosis not present

## 2021-05-25 DIAGNOSIS — H524 Presbyopia: Secondary | ICD-10-CM | POA: Diagnosis not present

## 2021-05-25 DIAGNOSIS — H16223 Keratoconjunctivitis sicca, not specified as Sjogren's, bilateral: Secondary | ICD-10-CM | POA: Diagnosis not present

## 2021-05-25 DIAGNOSIS — H25041 Posterior subcapsular polar age-related cataract, right eye: Secondary | ICD-10-CM | POA: Diagnosis not present

## 2021-05-25 DIAGNOSIS — H04123 Dry eye syndrome of bilateral lacrimal glands: Secondary | ICD-10-CM | POA: Diagnosis not present

## 2021-05-28 ENCOUNTER — Ambulatory Visit
Admission: RE | Admit: 2021-05-28 | Discharge: 2021-05-28 | Disposition: A | Discharge status: Home / Self Care | Payer: Medicare HMO | Source: Ambulatory Visit | Attending: Physician Assistant | Admitting: Physician Assistant

## 2021-05-28 DIAGNOSIS — Z1231 Encounter for screening mammogram for malignant neoplasm of breast: Secondary | ICD-10-CM

## 2021-06-06 DIAGNOSIS — M1712 Unilateral primary osteoarthritis, left knee: Secondary | ICD-10-CM | POA: Diagnosis not present

## 2021-06-06 DIAGNOSIS — M1711 Unilateral primary osteoarthritis, right knee: Secondary | ICD-10-CM | POA: Diagnosis not present

## 2021-06-07 DIAGNOSIS — C44319 Basal cell carcinoma of skin of other parts of face: Secondary | ICD-10-CM | POA: Diagnosis not present

## 2021-07-11 ENCOUNTER — Other Ambulatory Visit: Payer: Self-pay | Admitting: Physician Assistant

## 2021-07-11 ENCOUNTER — Ambulatory Visit (INDEPENDENT_AMBULATORY_CARE_PROVIDER_SITE_OTHER): Payer: Medicare HMO | Admitting: Physician Assistant

## 2021-07-11 ENCOUNTER — Encounter: Payer: Self-pay | Admitting: Physician Assistant

## 2021-07-11 VITALS — BP 142/81 | HR 92 | Ht 67.0 in | Wt 132.8 lb

## 2021-07-11 DIAGNOSIS — G4700 Insomnia, unspecified: Secondary | ICD-10-CM | POA: Diagnosis not present

## 2021-07-11 DIAGNOSIS — F411 Generalized anxiety disorder: Secondary | ICD-10-CM | POA: Diagnosis not present

## 2021-07-11 DIAGNOSIS — R413 Other amnesia: Secondary | ICD-10-CM | POA: Diagnosis not present

## 2021-07-11 MED ORDER — FLUVOXAMINE MALEATE 50 MG PO TABS: 50.0000 mg | ORAL_TABLET | Freq: Every day | ORAL | 1 refills | Status: DC

## 2021-07-11 MED ORDER — LORAZEPAM 1 MG PO TABS: 1.0000 mg | ORAL_TABLET | Freq: Every day | ORAL | 1 refills | Status: DC

## 2021-07-11 NOTE — Progress Notes (Signed)
[facility] MD/PA/NP Initial Note  07/11/2021 12:37 PM Madison Carr  MRN:  [id number]  Chief Complaint:  Chief Complaint   Establish Care    HPI:   Anxiety is really bad for about 2-3 months. Worries everyday about a lot of different things. Feels a heaviness in the pit of her stomach that comes and goes.  She is not really sure why she has gotten so anxious.  No known trigger.  Her husband and daughter have mentioned it to her and her daughter has even told her to "chill out."  She does not have panic attacks, just a generalized sense of unease most all the time.  She does have racing thoughts and obsesses about things.  That is new for her.  Has had trouble sleeping for years.  She has been on Ativan for around 35 years to help her sleep.  That is the only time of day she takes it.  It is helpful.  She takes 1 mg but she has tried to take 0.5 mg and has trouble falling asleep and staying asleep.  She does not snore or wake up gasping for air.  Goes to bed at midnight usually and gets up about 8.  She takes the Ativan close to midnight.  She never drank alcohol very much at all until the past few years.  She likes the taste of Chardonnay and has 2 glasses every night with dinner.  She does not get a buzz and has never had more than 2 glasses.  Does not get drunk.  See CAGE questionnaire.  Was started on amitriptyline many years ago for fibromyalgia.  Has been on the same dose all this time.  No complaints of pain now.  Patient denies loss of interest in usual activities and is able to enjoy things.  Denies decreased energy.  Denies decreased motivation.  She is retired.  ADLs and personal hygiene are normal.  Has decreased appetite.  She exercises on a daily basis.  Has started seeing a personal trainer again not long ago.  She had 1 pre-COVID.  No extreme sadness, tearfulness, or feelings of hopelessness. Denies any changes in concentration but states her memory has gotten really bad in the past  2 to 3 months as well.  She cannot remember if she has told her husband something or not, forgets what she went into her room for, things like that.  She is not sure if it is age or what.  Denies suicidal or homicidal thoughts.  Patient denies increased energy with decreased need for sleep, no increased talkativeness, no racing thoughts, no impulsivity or risky behaviors, no increased spending, no increased libido, no grandiosity, no increased irritability or anger, no paranoia and no hallucinations.   Visit Diagnosis:    ICD-10-CM   1. Generalized anxiety disorder  F41.1     2. Insomnia, unspecified type  G47.00     3. Memory loss  R41.3       Past Psychiatric History:   No mental health hospitalizations. No suicide attempts. No self harm.   Past medications for mental health diagnoses include: Elavil, Ativan, Trazodone only for a little while, was not effective.  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   nose   BLEPHAROPLASTY  2004   COLPOSCOPY W/ BIOPSY / CURETTAGE  07/11/2008   benign with focal koilocytic atypia, followup remained ASCUS with neg. HR HPV through 2013   ENDOMETRIAL ABLATION  2005   HEMORROIDECTOMY  01/27/2019   LAPAROSCOPIC RIGHT COLECTOMY N/A 08/25/2015   Procedure: Diagnostic LAPAROSCOPY;  Surgeon: Jackolyn Confer, MD;  [facility];  Service: General;  Laterality: N/A;   PARTIAL COLECTOMY  08/25/2015   Procedure: Extended right COLECTOMY;   Surgeon: Jackolyn Confer, MD;  Location[facility];  Service: General;;   [place]    Family Psychiatric History: see below  Family History:  Family History  Problem Relation Age of Onset   Melanoma Mother 51       left leg   Arthritis Mother    3 / [place] Mother    Dementia Father    Heart disease Father    Macular degeneration Father    Parkinson's disease Father    Macular degeneration Paternal Aunt    Macular degeneration Paternal Uncle    Cancer Paternal 55    Healthy Daughter    Eating disorder Daughter    Depression Son    Arthritis Son    Breast cancer Neg Hx     Social History:  Social History   Socioeconomic History   Marital status: Married    Spouse name: Not on file   Number of children: Not on file   Years of education: Not on file   Highest education level: Not on file  Occupational History   Not on file  Tobacco Use   Smoking status: Never   Smokeless tobacco: Never  Vaping Use   Vaping Use: Never used  Substance and Sexual Activity   Alcohol use: Not Currently    Alcohol/week: 14.0 standard drinks of alcohol    Types: 14 Glasses of wine per week   Drug use: No   Sexual activity: Yes    Partners: Male    Birth control/protection: Post-menopausal  Other Topics Concern   Not on file  Social History Narrative   Grew up in [place]. Dad was in [employer]. Mom was stay at home until she and her sister were older. Good childhood.       Marital status/children/pets: Married. High school sweethearts.  2 children., 3 granddaughters.   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       Legal-none   Caffeine-3-4 before 1:00 PM   Religion-Christian         Social Determinants of Health   Financial Resource Strain: Low Risk  (07/11/2021)   Overall Financial Resource Strain (CARDIA)    Difficulty of Paying Living  Expenses: Not hard at all  Food Insecurity: No Food Insecurity (07/11/2021)   Hunger Vital Sign    Worried About Running Out of Food in the Last Year: Never true    Ran Out of Food in the Last Year: Never true  Transportation Needs: No Transportation Needs (07/11/2021)   PRAPARE - Hydrologist (Medical): No    Lack of Transportation (  Non-Medical): No  Physical Activity: Sufficiently Active (07/11/2021)   Exercise Vital Sign    Days of Exercise per Week: 7 days    Minutes of Exercise per Session: 30 min  Stress: No Stress Concern Present (07/11/2021)   [place]    Feeling of Stress : Only a little  Social Connections: Socially Integrated (07/11/2021)   Social Connection and Isolation Panel [NHANES]    Frequency of Communication with Friends and Family: Twice a week    Frequency of Social Gatherings with Friends and Family: Twice a week    Attends Religious Services: More than 4 times per year    Active Member of Clubs or Organizations: Yes    Attends Music therapist: More than 4 times per year    Marital Status: Married    Allergies: No Known Allergies  Metabolic Disorder Labs: Lab Results  Component Value Date   HGBA1C 5.2 01/25/2020   MPG 97 08/25/2015   No results found for: "PROLACTIN" Lab Results  Component Value Date   CHOL 220 (H) 01/25/2020   TRIG 77.0 01/25/2020   HDL 100.90 01/25/2020   CHOLHDL 2 01/25/2020   VLDL 15.4 01/25/2020   LDLCALC 103 (H) 01/25/2020   [place] 94 07/22/2019   Lab Results  Component Value Date   TSH 1.27 01/25/2020   TSH 2.17 07/22/2019    Therapeutic Level Labs: No results found for: "LITHIUM" No results found for: "VALPROATE" No results found for: "CBMZ"  Current Medications: Current Outpatient Medications  Medication Sig Dispense Refill   amitriptyline (ELAVIL) 10 MG tablet Take 10 mg by mouth at bedtime.     Ascorbic  Acid (VITAMIN C) 500 MG CAPS Take 1 capsule by mouth daily.     Calcium Carbonate-Vitamin D3 600-400 MG-UNIT TABS 1 tablet with food     CALCIUM-MAGNESIUM-ZINC PO Take by mouth. Calcium '1000mg'$  Magnesium '400mg'$   Zinc '25mg'$      cholecalciferol (VITAMIN D) 1000 UNITS tablet Take 1,000 Units by mouth daily.     Coenzyme Q10 50 MG CAPS Take by mouth.     Cyanocobalamin (VITAMIN B-12 PO) Take 1,000 mg by mouth daily. Vitamin B12     Estradiol (YUVAFEM) 10 MCG TABS vaginal tablet Place 1 tablet (10 mcg total) vaginally 2 (two) times a week. 24 tablet 4   fluvoxaMINE (LUVOX) 50 MG tablet Take 1 tablet (50 mg total) by mouth at bedtime. 30 tablet 1   hydrochlorothiazide (HYDRODIURIL) 12.5 MG tablet Take 1 tablet (12.5 mg total) by mouth daily. 90 tablet 1   LORazepam (ATIVAN) 1 MG tablet Take 1 tablet (1 mg total) by mouth at bedtime. 30 tablet 1   Misc Natural Products (GLUCOSAMINE CHONDROITIN ADV PO) Take by mouth daily.     Misc Natural Products (OSTEO BI-FLEX TRIPLE STRENGTH PO) See admin instructions.     Multiple Vitamins-Minerals (OCUVITE PO) See admin instructions.     Multiple Vitamins-Minerals (ZINC PO) Take 1 tablet by mouth daily.     fluorouracil (EFUDEX) 5 % cream Apply 1 application topically 2 (two) times daily. (Patient not taking: No sig reported)     SHINGRIX injection  (Patient not taking: Reported on 07/11/2021)     valACYclovir (VALTREX) 1000 MG tablet 2 tabs at onset, then repeat 2 tabs in 12 hours once. (Patient not taking: Reported on 07/11/2021) 20 tablet 1   No current facility-administered medications for this visit.    Medication Side Effects: none  Orders placed  this visit:  No orders of the defined types were placed in this encounter.   Psychiatric Specialty Exam:  Review of Systems  Constitutional: Negative.   HENT: Negative.    Eyes:        Dry eyes  Respiratory: Negative.    Cardiovascular: Negative.   Gastrointestinal: Negative.   Endocrine: Negative.    Genitourinary: Negative.   Musculoskeletal: Negative.   Skin: Negative.   Allergic/Immunologic: Negative.   Neurological: Negative.   Hematological: Negative.   Psychiatric/Behavioral:         See HPI    Blood pressure (!) 142/81, pulse 92, height '5\' 7"'$  (1.702 m), weight 132 lb 12.8 oz (60.2 kg), last menstrual period 11/22/2003.Body mass index is 20.8 kg/m.  General Appearance: Casual and Well Groomed  Eye Contact:  Good  Speech:  Clear and Coherent and Normal Rate  Volume:  Normal  Mood:  Anxious  Affect:  Congruent  Thought Process:  Goal Directed and Descriptions of Associations: Circumstantial  Orientation:  Full (Time, Place, and Person)  Thought Content: Logical   Suicidal Thoughts:  No  Homicidal Thoughts:  No  Memory:  Immediate;   Poor Recent;   Fair Remote;   Fair  Judgement:  Good  Insight:  Good  Psychomotor Activity:  Normal  Concentration:  Concentration: Good  Recall:  Good  Fund of Knowledge: Good  Language: Good  Assets:  Desire for Improvement Financial Resources/Insurance Housing Transportation Vocational/Educational  ADL's:  Intact  Cognition: WNL  Prognosis:  Good   Screenings:  AUDIT    Flowsheet [place] Office Visit from 07/11/2021 in [facility]  Alcohol Use Disorder Identification Test Final Score (AUDIT) 5      CAGE-AID    [place] Office Visit from 07/11/2021 in [place] Score 2      [place] Office Visit from 07/11/2021 in [place] Visit from 09/15/2019 in [place]  Total GAD-7 Score 16 13      PHQ2-9    [place] Office Visit from 07/11/2021 in [place] Procedure visit from 07/19/2020 in [place] Office Visit from 02/28/2020 in [facility] Visit from 09/15/2019 in [place]  PHQ-2 Total Score 1 0 0 0        Receiving Psychotherapy: Yes  Jessie Deussing   Treatment Plan/Recommendations:  PDMP reviewed.  Ativan filled 05/22/2021 (#15) Prior to that her PCP gave her a 26-monthsupply at a time.  That provider left the practice so that the most recent prescription was written by another provider in that practice. I provided 65 minutes of face to face time during this encounter, including time spent before and after the visit in records review, medical decision making, counseling pertinent to today's visit, and charting.   We had a long discussion about the Ativan.  Since she has been on it so long I am not going to change it right now, but we will plan to after the anxiety improves, unless she stops drinking.  I understand she is only doing 2 glasses of wine every evening but drinking and taking a benzodiazepine is not appropriate.  She is separating the alcohol from the Ativan by approximately 5 to 6 hours and that is okay.  But I do not want to continue alcohol plus a benzo.  She verbalizes understanding.  As far as the anxiety  goes I recommend adding Luvox or Zoloft.  Any of the SSRIs are helpful with anxiety prevention, but Luvox tends to help people sleep so I recommend that 1.  Benefits, risks and side effects were discussed and she accepts.  She is on very low-dose amitriptyline which will not be a problem with the SSRI.  I think the memory issues is related somehow to the anxiety.  Once the anxiety improves, her memory will get better I think.  If not then I will refer to neurology.  She understands.  Continue amitriptyline 10 mg, 1 p.o. nightly. Start Luvox 50 mg, 1 p.o. nightly. Continue Ativan 1 mg, 1 p.o. nightly. Continue vitamins as per med list. Continue therapy with Evelena Peat Deussing. Return in 4 to 6 weeks.  Donnal Moat, PA-C

## 2021-07-11 NOTE — Telephone Encounter (Signed)
Please see pharmacy note.  

## 2021-07-16 DIAGNOSIS — H00025 Hordeolum internum left lower eyelid: Secondary | ICD-10-CM | POA: Diagnosis not present

## 2021-07-16 DIAGNOSIS — H0288B Meibomian gland dysfunction left eye, upper and lower eyelids: Secondary | ICD-10-CM | POA: Diagnosis not present

## 2021-07-16 DIAGNOSIS — H0288A Meibomian gland dysfunction right eye, upper and lower eyelids: Secondary | ICD-10-CM | POA: Diagnosis not present

## 2021-07-16 DIAGNOSIS — L03213 Periorbital cellulitis: Secondary | ICD-10-CM | POA: Diagnosis not present

## 2021-07-25 DIAGNOSIS — M1711 Unilateral primary osteoarthritis, right knee: Secondary | ICD-10-CM | POA: Diagnosis not present

## 2021-07-30 DIAGNOSIS — D23112 Other benign neoplasm of skin of right lower eyelid, including canthus: Secondary | ICD-10-CM | POA: Diagnosis not present

## 2021-07-30 DIAGNOSIS — H0014 Chalazion left upper eyelid: Secondary | ICD-10-CM | POA: Diagnosis not present

## 2021-07-30 DIAGNOSIS — H0288A Meibomian gland dysfunction right eye, upper and lower eyelids: Secondary | ICD-10-CM | POA: Diagnosis not present

## 2021-07-30 DIAGNOSIS — H0288B Meibomian gland dysfunction left eye, upper and lower eyelids: Secondary | ICD-10-CM | POA: Diagnosis not present

## 2021-08-01 DIAGNOSIS — M1711 Unilateral primary osteoarthritis, right knee: Secondary | ICD-10-CM | POA: Diagnosis not present

## 2021-08-02 ENCOUNTER — Other Ambulatory Visit: Payer: Self-pay

## 2021-08-02 DIAGNOSIS — N952 Postmenopausal atrophic vaginitis: Secondary | ICD-10-CM

## 2021-08-08 DIAGNOSIS — M1711 Unilateral primary osteoarthritis, right knee: Secondary | ICD-10-CM | POA: Diagnosis not present

## 2021-08-10 ENCOUNTER — Ambulatory Visit: Payer: Medicare HMO | Admitting: Plastic Surgery

## 2021-08-10 VITALS — BP 129/75 | HR 78 | Ht 67.0 in | Wt 132.8 lb

## 2021-08-10 DIAGNOSIS — H0014 Chalazion left upper eyelid: Secondary | ICD-10-CM | POA: Diagnosis not present

## 2021-08-10 DIAGNOSIS — D489 Neoplasm of uncertain behavior, unspecified: Secondary | ICD-10-CM

## 2021-08-10 DIAGNOSIS — D485 Neoplasm of uncertain behavior of skin: Secondary | ICD-10-CM | POA: Diagnosis not present

## 2021-08-10 NOTE — Progress Notes (Signed)
Referring Provider Ramond Dial, [place] [address] [place],  [place] [address]   CC:  Left upper eyelid chalazion and lower eyelid papilloma   Madison Carr is an 71 y.o. female.  HPI: Patient is a 71 year old who had a left upper eyelid infection and now has a remaining chalazion that has not resolved she also has a left lower eyelid papilloma that she would like excised.  She is a non-smoker.    No Known Allergies  Outpatient Encounter Medications as of 08/10/2021  Medication Sig   amitriptyline (ELAVIL) 10 MG tablet Take 10 mg by mouth at bedtime.   Ascorbic Acid (VITAMIN C) 500 MG CAPS Take 1 capsule by mouth daily.   Calcium Carbonate-Vitamin D3 600-400 MG-UNIT TABS 1 tablet with food   CALCIUM-MAGNESIUM-ZINC PO Take by mouth. Calcium '1000mg'$  Magnesium '400mg'$   Zinc '25mg'$    cholecalciferol (VITAMIN D) 1000 UNITS tablet Take 1,000 Units by mouth daily.   Coenzyme Q10 50 MG CAPS Take by mouth.   Cyanocobalamin (VITAMIN B-12 PO) Take 1,000 mg by mouth daily. Vitamin B12   Estradiol (YUVAFEM) 10 MCG TABS vaginal tablet Place 1 tablet (10 mcg total) vaginally 2 (two) times a week.   fluvoxaMINE (LUVOX) 50 MG tablet TAKE 1 TABLET BY MOUTH EVERYDAY AT BEDTIME   hydrochlorothiazide (HYDRODIURIL) 12.5 MG tablet Take 1 tablet (12.5 mg total) by mouth daily.   LORazepam (ATIVAN) 1 MG tablet Take 1 tablet (1 mg total) by mouth at bedtime.   Multiple Vitamins-Minerals (OCUVITE PO) See admin instructions.   Multiple Vitamins-Minerals (ZINC PO) Take 1 tablet by mouth daily.   SHINGRIX injection    [DISCONTINUED] fluorouracil (EFUDEX) 5 % cream Apply 1 application topically 2 (two) times daily. (Patient not taking: No sig reported)   [DISCONTINUED] Misc Natural Products (GLUCOSAMINE CHONDROITIN ADV PO) Take by mouth daily.   [DISCONTINUED] Misc Natural Products (OSTEO BI-FLEX TRIPLE STRENGTH PO) See admin instructions.   [DISCONTINUED] valACYclovir (VALTREX) 1000 MG tablet 2 tabs at  onset, then repeat 2 tabs in 12 hours once. (Patient not taking: Reported on 07/11/2021)   No facility-administered encounter medications on file as of 08/10/2021.     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   nose   BLEPHAROPLASTY  2004   COLPOSCOPY W/ BIOPSY / CURETTAGE  07/11/2008   benign with focal koilocytic atypia, followup remained ASCUS with neg. HR HPV through 2013   ENDOMETRIAL ABLATION  2005   HEMORROIDECTOMY  01/27/2019   LAPAROSCOPIC RIGHT COLECTOMY N/A 08/25/2015   Procedure: Diagnostic LAPAROSCOPY;  Surgeon: Jackolyn Confer, MD;  [facility];  Service: General;  Laterality: N/A;   PARTIAL COLECTOMY  08/25/2015   Procedure: Extended right COLECTOMY;  Surgeon: Jackolyn Confer, MD;  Location: [facility];  Service: General;;   [place]    Family History  Problem Relation Age of Onset   Melanoma  Mother 38       left leg   Arthritis Mother    Miscarriages / [place] Mother    Dementia Father    Heart disease Father    Macular degeneration Father    Parkinson's disease Father    Macular degeneration Paternal Aunt    Macular degeneration Paternal Uncle    Cancer Paternal 40    Healthy Daughter    Eating disorder Daughter    Depression Son    Arthritis Son    Breast cancer Neg Hx     Social History   Social History Narrative   Grew up in [place]. Dad was in [employer]. Mom was stay at home until she and her  sister were older. Good childhood.       Marital status/children/pets: Married. High school sweethearts.  2 children., 3 granddaughters.   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       Legal-none   Caffeine-3-4 before 1:00 PM   Religion-Christian           Review of Systems General: Denies fevers, chills, weight loss CV: Denies chest pain, shortness of breath, palpitations   Physical Exam    08/10/2021   11:35 AM 07/11/2021   11:12 AM 08/09/2020    5:27 PM  Vitals with BMI  Height '5\' 7"'$   '5\' 7"'$   Weight 132 lbs 13 oz  138 lbs 10 oz  BMI 63.84  53.6  Systolic 468  032  Diastolic 75  79  Pulse 78  84     Information is confidential and restricted. Go to Review Flowsheets to unlock data.    General:  No acute distress,  Alert and oriented, Non-Toxic, Normal speech and affect HEENT: Left upper eyelid chalazion that does not appear to communicate with the conjunctival surface but is prominent on the lid surface.  She has a 2 mm lower eyelid papilloma.  Assessment/Plan We will plan treatment of left upper eyelid chalazion through transcutaneous approach and removal of left lower eyelid papilloma.  Patient is agreeable that the best way to do this for her would be in the operating room with some sedation or general anesthesia.    [name] 08/10/2021, 1:53 PM

## 2021-08-22 ENCOUNTER — Encounter: Payer: Self-pay | Admitting: Physician Assistant

## 2021-08-22 ENCOUNTER — Ambulatory Visit: Payer: Medicare HMO | Admitting: Physician Assistant

## 2021-08-22 DIAGNOSIS — R413 Other amnesia: Secondary | ICD-10-CM

## 2021-08-22 DIAGNOSIS — G4700 Insomnia, unspecified: Secondary | ICD-10-CM

## 2021-08-22 DIAGNOSIS — F411 Generalized anxiety disorder: Secondary | ICD-10-CM | POA: Diagnosis not present

## 2021-08-22 MED ORDER — FLUVOXAMINE MALEATE 100 MG PO TABS: 100.0000 mg | ORAL_TABLET | Freq: Every day | ORAL | 1 refills | Status: DC

## 2021-08-22 NOTE — Progress Notes (Signed)
[facility]  Patient ID: Madison Carr,  MRN: [id number]  PCP: Ramond Dial, PA  Date of Evaluation: 08/22/2021 Time spent:20 minutes  Chief Complaint:  Chief Complaint   Anxiety    HISTORY/CURRENT STATUS: HPI for 6-week med check.  Madison Carr was started on Luvox at the last appointment.  States she is approximately 50% better as far as the anxiety goes!  She can tell she is not bothered by things as much as she had been.  She over thinks things still, states she always has but it is not quite as bad.  She still has trouble remembering things but it is no worse.    Patient is able to enjoy things.  Energy and motivation are good.  No extreme sadness, tearfulness, or feelings of hopelessness.  Sleeps well most of the time, with the exception of the past 2 nights.  Not really sure why she did not sleep well except maybe because she went to one of her grandkids birthday parties and there were a lot of kids running around.  Even though it was fun it might have been too activating.  ADLs and personal hygiene are normal.   Denies any changes in concentration or making decisions.  Appetite has not changed.  Weight is stable.  Denies suicidal or homicidal thoughts.  Denies dizziness, syncope, seizures, numbness, tingling, tremor, tics, unsteady gait, slurred speech, confusion. Denies muscle or joint pain, stiffness, or dystonia.  Individual Medical History/ Review of Systems: Changes? :Yes  had cellulitis of her left eye since our last visit.  Allergies: Patient has no known allergies.  Current Medications:  Current Outpatient Medications:    amitriptyline (ELAVIL) 10 MG tablet, Take 10 mg by mouth at bedtime., Disp: , Rfl:    Ascorbic Acid (VITAMIN C) 500 MG CAPS, Take 1 capsule by mouth daily., Disp: , Rfl:    Calcium Carbonate-Vitamin D3 600-400 MG-UNIT TABS, 1 tablet with food, Disp: , Rfl:    CALCIUM-MAGNESIUM-ZINC PO, Take by mouth. Calcium '1000mg'$  Magnesium '400mg'$   Zinc '25mg'$ ,  Disp: , Rfl:    cholecalciferol (VITAMIN D) 1000 UNITS tablet, Take 1,000 Units by mouth daily., Disp: , Rfl:    Coenzyme Q10 50 MG CAPS, Take by mouth., Disp: , Rfl:    Cyanocobalamin (VITAMIN B-12 PO), Take 1,000 mg by mouth daily. Vitamin B12, Disp: , Rfl:    Estradiol (YUVAFEM) 10 MCG TABS vaginal tablet, Place 1 tablet (10 mcg total) vaginally 2 (two) times a week., Disp: 24 tablet, Rfl: 4   fluvoxaMINE (LUVOX) 100 MG tablet, Take 1 tablet (100 mg total) by mouth at bedtime., Disp: 30 tablet, Rfl: 1   hydrochlorothiazide (HYDRODIURIL) 12.5 MG tablet, Take 1 tablet (12.5 mg total) by mouth daily., Disp: 90 tablet, Rfl: 1   LORazepam (ATIVAN) 1 MG tablet, Take 1 tablet (1 mg total) by mouth at bedtime., Disp: 30 tablet, Rfl: 1   Multiple Vitamins-Minerals (OCUVITE PO), See admin instructions., Disp: , Rfl:    Multiple Vitamins-Minerals (ZINC PO), Take 1 tablet by mouth daily., Disp: , Rfl:    SHINGRIX injection, , Disp: , Rfl:  Medication Side Effects: none  Family Medical/ Social History: Changes? No  MENTAL HEALTH EXAM:  Last menstrual period 11/22/2003.There is no height or weight on file to calculate BMI.  General Appearance: Casual and Well Groomed  Eye Contact:  Good  Speech:  Clear and Coherent and Normal Rate  Volume:  Normal  Mood:  Euthymic  Affect:  Congruent  Thought  Process:  Goal Directed and Descriptions of Associations: Circumstantial  Orientation:  Full (Time, Place, and Person)  Thought Content: Logical   Suicidal Thoughts:  No  Homicidal Thoughts:  No  Memory:  Immediate;   Fair Recent;   Fair Remote;   Good  Judgement:  Good  Insight:  Good  Psychomotor Activity:  Normal  Concentration:  Concentration: Good  Recall:  Good  Fund of Knowledge: Good  Language: Good  Assets:  Desire for Improvement Financial Resources/Insurance Housing Transportation  ADL's:  Intact  Cognition: WNL  Prognosis:  Good    DIAGNOSES:    ICD-10-CM   1. Generalized  anxiety disorder  F41.1     2. Insomnia, unspecified type  G47.00     3. Memory loss  R41.3       Receiving Psychotherapy: Yes  with Evelena Peat Deussing  RECOMMENDATIONS:  PDMP reviewed.  Ativan filled 08/18/2021. I provided 20 minutes of face to face time during this encounter, including time spent before and after the visit in records review, medical decision making, counseling pertinent to today's visit, and charting.   She is doing well since starting the Luvox around 6 weeks ago.  I recommend increasing the dose to help with the anxiety even more.  She is very open to it.  We again discussed the Ativan.  She understands that any benzo is not recommended in the elderly due to risk of falls, confusion and memory issues.  Also she does drink 1 to 2 glasses of wine every evening.  States she separates the alcohol and Ativan by 4 hours at least.  Having said all that, she has been on the Ativan for many years and it makes her anxious to even go off.  The goal would be for the Luvox to help with the anxiety so much that she would not need the Ativan, or at least as often or at as high of a dose.  She understands.  I do not want to make any changes in the Ativan yet.  We discussed the memory issues again and if the symptoms worsen or do not improve with changes we are making then I will refer to neurology.  Continue amitriptyline 10 mg, 1 p.o. nightly. Increase Luvox to 100 mg, 1 p.o. nightly. Continue Ativan 1 mg, 1 p.o. nightly as needed. Continue multivitamin, vitamin D, fish oil, B12, CoQ 10 Continue therapy with Evelena Peat Deussing. Return in 4 to 6 weeks.  Donnal Moat, PA-C

## 2021-08-22 NOTE — Patient Instructions (Signed)
Increase the Luvox 50 mg by taking 2 pills at night until you run out, then get the Luvox 100 mg filled and take 1 every night.

## 2021-08-23 ENCOUNTER — Telehealth: Payer: Self-pay | Admitting: *Deleted

## 2021-08-23 ENCOUNTER — Telehealth: Payer: Self-pay | Admitting: Plastic Surgery

## 2021-08-23 NOTE — Telephone Encounter (Signed)
Call placed to pt. Updated her that insurance authorization is in progress and we will contact her to schedule surgery when they respond. Pt voiced understanding.

## 2021-08-23 NOTE — Telephone Encounter (Signed)
Patient left vmail to follow up on sx for eyelids;requesting call back for scheduling. Please advise at [phone].

## 2021-08-27 ENCOUNTER — Telehealth: Payer: Self-pay

## 2021-08-27 NOTE — Telephone Encounter (Signed)
No action required for cpt [place], [id number] and [phone]

## 2021-09-03 DIAGNOSIS — L718 Other rosacea: Secondary | ICD-10-CM | POA: Diagnosis not present

## 2021-09-03 DIAGNOSIS — L03213 Periorbital cellulitis: Secondary | ICD-10-CM | POA: Diagnosis not present

## 2021-09-04 ENCOUNTER — Other Ambulatory Visit (HOSPITAL_BASED_OUTPATIENT_CLINIC_OR_DEPARTMENT_OTHER): Payer: Self-pay | Admitting: *Deleted

## 2021-09-04 DIAGNOSIS — N952 Postmenopausal atrophic vaginitis: Secondary | ICD-10-CM

## 2021-09-04 MED ORDER — ESTRADIOL 10 MCG VA TABS: 1.0000 | ORAL_TABLET | VAGINAL | 1 refills | Status: DC

## 2021-09-17 ENCOUNTER — Other Ambulatory Visit: Payer: Self-pay

## 2021-09-17 ENCOUNTER — Encounter (HOSPITAL_BASED_OUTPATIENT_CLINIC_OR_DEPARTMENT_OTHER): Payer: Self-pay | Admitting: Plastic Surgery

## 2021-09-17 DIAGNOSIS — H0014 Chalazion left upper eyelid: Secondary | ICD-10-CM | POA: Diagnosis not present

## 2021-09-17 DIAGNOSIS — L03213 Periorbital cellulitis: Secondary | ICD-10-CM | POA: Diagnosis not present

## 2021-09-17 DIAGNOSIS — L718 Other rosacea: Secondary | ICD-10-CM | POA: Diagnosis not present

## 2021-09-19 ENCOUNTER — Encounter: Payer: Self-pay | Admitting: Surgical

## 2021-09-19 ENCOUNTER — Encounter (HOSPITAL_BASED_OUTPATIENT_CLINIC_OR_DEPARTMENT_OTHER)
Admission: RE | Admit: 2021-09-19 | Discharge: 2021-09-19 | Disposition: A | Discharge status: Home / Self Care | Payer: Medicare HMO | Source: Ambulatory Visit | Attending: Plastic Surgery | Admitting: Plastic Surgery

## 2021-09-19 ENCOUNTER — Ambulatory Visit (INDEPENDENT_AMBULATORY_CARE_PROVIDER_SITE_OTHER): Payer: Medicare HMO | Admitting: Surgical

## 2021-09-19 VITALS — BP 117/70 | HR 83 | Ht 67.0 in | Wt 133.2 lb

## 2021-09-19 DIAGNOSIS — I10 Essential (primary) hypertension: Secondary | ICD-10-CM | POA: Insufficient documentation

## 2021-09-19 DIAGNOSIS — H0014 Chalazion left upper eyelid: Secondary | ICD-10-CM

## 2021-09-19 DIAGNOSIS — Z01818 Encounter for other preprocedural examination: Secondary | ICD-10-CM | POA: Diagnosis not present

## 2021-09-19 LAB — BASIC METABOLIC PANEL
Anion gap: 6 (ref 5–15)
BUN: 13 mg/dL (ref 8–23)
CO2: 30 mmol/L (ref 22–32)
Calcium: 9.1 mg/dL (ref 8.9–10.3)
Chloride: 104 mmol/L (ref 98–111)
Creatinine, Ser: 0.88 mg/dL (ref 0.44–1.00)
GFR, Estimated: >60 mL/min (ref >60)
Glucose, Bld: 108 mg/dL — ABNORMAL HIGH (ref 70–99)
Potassium: 4.2 mmol/L (ref 3.5–5.1)
Sodium: 140 mmol/L (ref 135–145)

## 2021-09-19 MED ORDER — ONDANSETRON HCL 4 MG PO TABS: 4.0000 mg | ORAL_TABLET | Freq: Three times a day (TID) | ORAL | 0 refills | Status: DC | PRN

## 2021-09-19 MED ORDER — OXYCODONE HCL 5 MG PO TABS: 5.0000 mg | ORAL_TABLET | Freq: Four times a day (QID) | ORAL | 0 refills | Status: DC | PRN

## 2021-09-19 NOTE — H&P (View-Only) (Signed)
Patient ID: Madison Carr, female    DOB: [birth year], 71 y.o.   MRN: [id number]  Chief Complaint  Patient presents with   Pre-op Exam      ICD-10-CM   1. Chalazion left upper eyelid  H00.14       History of Present Illness: Madison Carr is a 71 y.o.  female  with a history of chalazion.  She presents for preoperative evaluation for upcoming procedure, minor excision of chalazion -left, scheduled for 09/25/2021 with Dr. Erin Hearing.  Patient reports high tolerance with medications, specifically with anesthesia.  No other complications or issues with anesthesia in the past. No history of DVT/PE.  No family history of DVT/PE.  No family or personal history of bleeding or clotting disorders.  Patient is not currently taking any blood thinners.  No history of CVA/MI.   Summary of Previous Visit: Plan for treatment of left upper eyelid chalazion through transcutaneous approach and removal of left lower eyelid papilloma.  PMH Significant for: Paroxysmal tachycardia, fibromyalgia, basal cell carcinoma and squamous cell carcinoma, bilateral upper eyelid blepharoplasty approximately 20 years ago  She does report that she feels as if she is developing an additional chalazion of the right lower medial eyelid, reports she has recently finished doxycycline and has been applying an antibiotic ointment.   Past Medical History: Allergies: No Known Allergies  Current Medications:  Current Outpatient Medications:    amitriptyline (ELAVIL) 10 MG tablet, Take 10 mg by mouth at bedtime., Disp: , Rfl:    Ascorbic Acid (VITAMIN C) 500 MG CAPS, Take 1 capsule by mouth daily., Disp: , Rfl:    Calcium Carbonate-Vitamin D3 600-400 MG-UNIT TABS, 1 tablet with food, Disp: , Rfl:    CALCIUM-MAGNESIUM-ZINC PO, Take by mouth. Calcium '1000mg'$  Magnesium '400mg'$   Zinc '25mg'$ , Disp: , Rfl:    cholecalciferol (VITAMIN D) 1000 UNITS tablet, Take 1,000 Units by mouth daily., Disp: , Rfl:    Coenzyme Q10 50 MG CAPS, Take by  mouth., Disp: , Rfl:    Cyanocobalamin (VITAMIN B-12 PO), Take 1,000 mg by mouth daily. Vitamin B12, Disp: , Rfl:    Estradiol (YUVAFEM) 10 MCG TABS vaginal tablet, Place 1 tablet (10 mcg total) vaginally 2 (two) times a week., Disp: 24 tablet, Rfl: 1   fluvoxaMINE (LUVOX) 100 MG tablet, Take 1 tablet (100 mg total) by mouth at bedtime., Disp: 30 tablet, Rfl: 1   hydrochlorothiazide (HYDRODIURIL) 12.5 MG tablet, Take 1 tablet (12.5 mg total) by mouth daily., Disp: 90 tablet, Rfl: 1   LORazepam (ATIVAN) 1 MG tablet, Take 1 tablet (1 mg total) by mouth at bedtime., Disp: 30 tablet, Rfl: 1   Multiple Vitamins-Minerals (OCUVITE PO), See admin instructions., Disp: , Rfl:    Multiple Vitamins-Minerals (ZINC PO), Take 1 tablet by mouth daily., Disp: , Rfl:    ondansetron (ZOFRAN) 4 MG tablet, Take 1 tablet (4 mg total) by mouth every 8 (eight) hours as needed for nausea or vomiting., Disp: 20 tablet, Rfl: 0   oxyCODONE (OXY IR/ROXICODONE) 5 MG immediate release tablet, Take 1 tablet (5 mg total) by mouth every 6 (six) hours as needed for up to 8 doses for severe pain., Disp: 8 tablet, Rfl: 0   SHINGRIX injection, , Disp: , Rfl:   Past Medical Problems: Past Medical History:  Diagnosis Date   BCC (basal cell carcinoma of skin)    Right [place]) (per patient done years ago)   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   nose   BLEPHAROPLASTY  2004   COLPOSCOPY W/ BIOPSY / CURETTAGE  07/11/2008   benign with focal koilocytic atypia, followup remained ASCUS with neg. HR HPV through 2013    ENDOMETRIAL ABLATION  2005   HEMORROIDECTOMY  01/27/2019   LAPAROSCOPIC RIGHT COLECTOMY N/A 08/25/2015   Procedure: Diagnostic LAPAROSCOPY;  Surgeon: Jackolyn Confer, MD;  Location: [facility];  Service: General;  Laterality: N/A;   PARTIAL COLECTOMY  08/25/2015   Procedure: Extended right COLECTOMY;  Surgeon: Jackolyn Confer, MD;  Location: [facility];  Service: General;;   [place]    Social History: Social History   Socioeconomic History   Marital status: Married    Spouse name: Not on file   Number of children: Not on file   Years of education: Not on file   Highest education level: Not on file  Occupational History   Not on file  Tobacco Use   Smoking status: Never   Smokeless tobacco: Never  Vaping Use   Vaping Use: Never used  Substance and Sexual Activity   Alcohol use: Yes    Alcohol/week: 10.0 - 14.0 standard drinks of alcohol    Types: 10 - 14 Glasses of wine per week    Comment: wine w dinner   Drug use: No   Sexual activity: Yes    Partners: Male    Birth control/protection: Post-menopausal  Other Topics Concern   Not on file  Social History Narrative   Grew up in [place]. Dad was in [employer]. Mom was stay at home until she and her sister were older. Good childhood.       Marital status/children/pets: Married. High school sweethearts.  2 children., 3 granddaughters.   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       Legal-none   Caffeine-3-4 before 1:00 PM   Religion-Christian         Social Determinants of Health   Financial Resource Strain: Low Risk  (07/11/2021)   Overall Financial Resource Strain (CARDIA)    Difficulty of Paying Living Expenses: Not hard at all  Food Insecurity: No Food Insecurity (07/11/2021)   Hunger Vital Sign    Worried About Running Out of Food in the Last Year: Never true    Ran Out of Food in the Last Year: Never true   Transportation Needs: No Transportation Needs (07/11/2021)   PRAPARE - Hydrologist (Medical): No    Lack of Transportation (Non-Medical): No  Physical Activity: Sufficiently Active (07/11/2021)   Exercise Vital Sign    Days of Exercise per Week: 7 days    Minutes of Exercise per Session: 30 min  Stress: No Stress Concern Present (07/11/2021)   [place]    Feeling of Stress : Only a little  Social Connections: Socially Integrated (07/11/2021)   Social Connection and Isolation Panel [NHANES]    Frequency of Communication with Friends and  Family: Twice a week    Frequency of Social Gatherings with Friends and Family: Twice a week    Attends Religious Services: More than 4 times per year    Active Member of [employer] or Organizations: Yes    Attends Music therapist: More than 4 times per year    Marital Status: Married  Human resources officer Violence: Not on file    Family History: Family History  Problem Relation Age of Onset   Melanoma Mother 70       left leg   Arthritis Mother    Miscarriages / [place] Mother    Dementia Father    Heart disease Father    Macular degeneration Father    Parkinson's disease Father    Macular degeneration Paternal Aunt    Macular degeneration Paternal Uncle    Cancer Paternal 7    Healthy Daughter    Eating disorder Daughter    Depression Son    Arthritis Son    Breast cancer Neg Hx     Review of Systems: Review of Systems  Constitutional: Negative.   Respiratory: Negative.    Cardiovascular: Negative.     Physical Exam: Vital Signs BP 117/70 (BP Location: Left Arm, Patient Position: Sitting, Cuff Size: Small)   Pulse 83   Ht '5\' 7"'$  (1.702 m)   Wt 133 lb 3.2 oz (60.4 kg)   LMP 11/22/2003 (Approximate)   SpO2 97%   BMI 20.86 kg/m   Physical Exam Constitutional:      General: Not in acute distress.    Appearance:  Normal appearance. Not ill-appearing.  HENT:     Head: Normocephalic and atraumatic.  Eyes: Chalazion noted of left upper outer eyelid    Pupils: Pupils are equal, round  Neck:     Musculoskeletal: Normal range of motion.  Cardiovascular:     Rate and Rhythm: Normal rate    Pulses: Normal pulses.  Pulmonary:     Effort: Pulmonary effort is normal. No respiratory distress.  Abdominal:     General: Abdomen is flat. There is no distension.  Musculoskeletal: Normal range of motion.  Skin:    General: Skin is warm and dry.     Findings: No erythema or rash.  Neurological:     General: No focal deficit present.     Mental Status: Alert and oriented to person, place, and time. Mental status is at baseline.     Motor: No weakness.  Psychiatric:        Mood and Affect: Mood normal.        Behavior: Behavior normal.    Assessment/Plan: The patient is scheduled for minor excision of left chalazion, possible excision of left lower eyelid papilloma with Dr. Erin Hearing.  Risks, benefits, and alternatives of procedure discussed, questions answered and consent obtained.    Smoking Status: Non-smoker; Counseling Given?  N/A  Caprini Score: 3; Risk Factors include: Age and length of planned surgery. Recommendation for mechanical  prophylaxis. Encourage early ambulation.   Pictures obtained: 08/10/2021  Post-op Rx sent to pharmacy: Oxycodone, Zofran  Patient was provided with the General Surgical Risk consent document and Pain Medication Agreement prior to their appointment.  They had adequate time to read through the risk consent documents and Pain Medication Agreement. We also discussed them in person together during this preop appointment. All of their questions were answered to their satisfaction.  Recommended calling if they have any further questions.  Risk consent form and Pain Medication Agreement to be  scanned into patient's chart.  We discussed risk specific to eyelid surgery, including  but not limited to excision of left upper eyelid chalazion including swelling, bruising, damage to surrounding structures, need for additional procedures, infection, bleeding  Recommend holding CoQ 10, multivitamin, vitamin D prior to surgery.    Electronically signed by: Carola Rhine Oluwadamilola Rosamond, PA-C 09/19/2021 9:24 AM

## 2021-09-19 NOTE — Progress Notes (Signed)
Patient ID: Madison Carr, female    DOB: [birth year], 71 y.o.   MRN: [id number]  Chief Complaint  Patient presents with   Pre-op Exam      ICD-10-CM   1. Chalazion left upper eyelid  H00.14       History of Present Illness: Madison Carr is a 71 y.o.  female  with a history of chalazion.  She presents for preoperative evaluation for upcoming procedure, minor excision of chalazion -left, scheduled for 09/25/2021 with Dr. Erin Hearing.  Patient reports high tolerance with medications, specifically with anesthesia.  No other complications or issues with anesthesia in the past. No history of DVT/PE.  No family history of DVT/PE.  No family or personal history of bleeding or clotting disorders.  Patient is not currently taking any blood thinners.  No history of CVA/MI.   Summary of Previous Visit: Plan for treatment of left upper eyelid chalazion through transcutaneous approach and removal of left lower eyelid papilloma.  PMH Significant for: Paroxysmal tachycardia, fibromyalgia, basal cell carcinoma and squamous cell carcinoma, bilateral upper eyelid blepharoplasty approximately 20 years ago  She does report that she feels as if she is developing an additional chalazion of the right lower medial eyelid, reports she has recently finished doxycycline and has been applying an antibiotic ointment.   Past Medical History: Allergies: No Known Allergies  Current Medications:  Current Outpatient Medications:    amitriptyline (ELAVIL) 10 MG tablet, Take 10 mg by mouth at bedtime., Disp: , Rfl:    Ascorbic Acid (VITAMIN C) 500 MG CAPS, Take 1 capsule by mouth daily., Disp: , Rfl:    Calcium Carbonate-Vitamin D3 600-400 MG-UNIT TABS, 1 tablet with food, Disp: , Rfl:    CALCIUM-MAGNESIUM-ZINC PO, Take by mouth. Calcium '1000mg'$  Magnesium '400mg'$   Zinc '25mg'$ , Disp: , Rfl:    cholecalciferol (VITAMIN D) 1000 UNITS tablet, Take 1,000 Units by mouth daily., Disp: , Rfl:    Coenzyme Q10 50 MG CAPS, Take by  mouth., Disp: , Rfl:    Cyanocobalamin (VITAMIN B-12 PO), Take 1,000 mg by mouth daily. Vitamin B12, Disp: , Rfl:    Estradiol (YUVAFEM) 10 MCG TABS vaginal tablet, Place 1 tablet (10 mcg total) vaginally 2 (two) times a week., Disp: 24 tablet, Rfl: 1   fluvoxaMINE (LUVOX) 100 MG tablet, Take 1 tablet (100 mg total) by mouth at bedtime., Disp: 30 tablet, Rfl: 1   hydrochlorothiazide (HYDRODIURIL) 12.5 MG tablet, Take 1 tablet (12.5 mg total) by mouth daily., Disp: 90 tablet, Rfl: 1   LORazepam (ATIVAN) 1 MG tablet, Take 1 tablet (1 mg total) by mouth at bedtime., Disp: 30 tablet, Rfl: 1   Multiple Vitamins-Minerals (OCUVITE PO), See admin instructions., Disp: , Rfl:    Multiple Vitamins-Minerals (ZINC PO), Take 1 tablet by mouth daily., Disp: , Rfl:    ondansetron (ZOFRAN) 4 MG tablet, Take 1 tablet (4 mg total) by mouth every 8 (eight) hours as needed for nausea or vomiting., Disp: 20 tablet, Rfl: 0   oxyCODONE (OXY IR/ROXICODONE) 5 MG immediate release tablet, Take 1 tablet (5 mg total) by mouth every 6 (six) hours as needed for up to 8 doses for severe pain., Disp: 8 tablet, Rfl: 0   SHINGRIX injection, , Disp: , Rfl:   Past Medical Problems: Past Medical History:  Diagnosis Date   BCC (basal cell carcinoma of skin)    Right [place] [facility]) (per patient done years ago)   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   nose   BLEPHAROPLASTY  2004   COLPOSCOPY W/ BIOPSY / CURETTAGE  07/11/2008   benign with focal koilocytic atypia, followup remained ASCUS with neg. HR HPV through 2013    ENDOMETRIAL ABLATION  2005   HEMORROIDECTOMY  01/27/2019   LAPAROSCOPIC RIGHT COLECTOMY N/A 08/25/2015   Procedure: Diagnostic LAPAROSCOPY;  Surgeon: Jackolyn Confer, MD;  Location: [facility];  Service: General;  Laterality: N/A;   PARTIAL COLECTOMY  08/25/2015   Procedure: Extended right COLECTOMY;  Surgeon: Jackolyn Confer, MD;  Location: [facility];  Service: General;;   [place]    Social History: Social History   Socioeconomic History   Marital status: Married    Spouse name: Not on file   Number of children: Not on file   Years of education: Not on file   Highest education level: Not on file  Occupational History   Not on file  Tobacco Use   Smoking status: Never   Smokeless tobacco: Never  Vaping Use   Vaping Use: Never used  Substance and Sexual Activity   Alcohol use: Yes    Alcohol/week: 10.0 - 14.0 standard drinks of alcohol    Types: 10 - 14 Glasses of wine per week    Comment: wine w dinner   Drug use: No   Sexual activity: Yes    Partners: Male    Birth control/protection: Post-menopausal  Other Topics Concern   Not on file  Social History Narrative   Grew up in [place]. Dad was in [employer]. Mom was stay at home until she and her sister were older. Good childhood.       Marital status/children/pets: Married. High school sweethearts.  2 children., 3 granddaughters.   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       Legal-none   Caffeine-3-4 before 1:00 PM   Religion-Christian         Social Determinants of Health   Financial Resource Strain: Low Risk  (07/11/2021)   Overall Financial Resource Strain (CARDIA)    Difficulty of Paying Living Expenses: Not hard at all  Food Insecurity: No Food Insecurity (07/11/2021)   Hunger Vital Sign    Worried About Running Out of Food in the Last Year: Never true    Ran Out of Food in the Last Year: Never true   Transportation Needs: No Transportation Needs (07/11/2021)   PRAPARE - Hydrologist (Medical): No    Lack of Transportation (Non-Medical): No  Physical Activity: Sufficiently Active (07/11/2021)   Exercise Vital Sign    Days of Exercise per Week: 7 days    Minutes of Exercise per Session: 30 min  Stress: No Stress Concern Present (07/11/2021)   [place]    Feeling of Stress : Only a little  Social Connections: Socially Integrated (07/11/2021)   Social Connection and Isolation Panel [NHANES]    Frequency of Communication with Friends and  Family: Twice a week    Frequency of Social Gatherings with Friends and Family: Twice a week    Attends Religious Services: More than 4 times per year    Active Member of [employer] or Organizations: Yes    Attends Music therapist: More than 4 times per year    Marital Status: Married  Human resources officer Violence: Not on file    Family History: Family History  Problem Relation Age of Onset   Melanoma Mother 61       left leg   Arthritis Mother    Miscarriages / [place] Mother    Dementia Father    Heart disease Father    Macular degeneration Father    Parkinson's disease Father    Macular degeneration Paternal Aunt    Macular degeneration Paternal Uncle    Cancer Paternal 6    Healthy Daughter    Eating disorder Daughter    Depression Son    Arthritis Son    Breast cancer Neg Hx     Review of Systems: Review of Systems  Constitutional: Negative.   Respiratory: Negative.    Cardiovascular: Negative.     Physical Exam: Vital Signs BP 117/70 (BP Location: Left Arm, Patient Position: Sitting, Cuff Size: Small)   Pulse 83   Ht '5\' 7"'$  (1.702 m)   Wt 133 lb 3.2 oz (60.4 kg)   LMP 11/22/2003 (Approximate)   SpO2 97%   BMI 20.86 kg/m   Physical Exam Constitutional:      General: Not in acute distress.    Appearance:  Normal appearance. Not ill-appearing.  HENT:     Head: Normocephalic and atraumatic.  Eyes: Chalazion noted of left upper outer eyelid    Pupils: Pupils are equal, round  Neck:     Musculoskeletal: Normal range of motion.  Cardiovascular:     Rate and Rhythm: Normal rate    Pulses: Normal pulses.  Pulmonary:     Effort: Pulmonary effort is normal. No respiratory distress.  Abdominal:     General: Abdomen is flat. There is no distension.  Musculoskeletal: Normal range of motion.  Skin:    General: Skin is warm and dry.     Findings: No erythema or rash.  Neurological:     General: No focal deficit present.     Mental Status: Alert and oriented to person, place, and time. Mental status is at baseline.     Motor: No weakness.  Psychiatric:        Mood and Affect: Mood normal.        Behavior: Behavior normal.    Assessment/Plan: The patient is scheduled for minor excision of left chalazion, possible excision of left lower eyelid papilloma with Dr. Erin Hearing.  Risks, benefits, and alternatives of procedure discussed, questions answered and consent obtained.    Smoking Status: Non-smoker; Counseling Given?  N/A  Caprini Score: 3; Risk Factors include: Age and length of planned surgery. Recommendation for mechanical  prophylaxis. Encourage early ambulation.   Pictures obtained: 08/10/2021  Post-op Rx sent to pharmacy: Oxycodone, Zofran  Patient was provided with the General Surgical Risk consent document and Pain Medication Agreement prior to their appointment.  They had adequate time to read through the risk consent documents and Pain Medication Agreement. We also discussed them in person together during this preop appointment. All of their questions were answered to their satisfaction.  Recommended calling if they have any further questions.  Risk consent form and Pain Medication Agreement to be  scanned into patient's chart.  We discussed risk specific to eyelid surgery, including  but not limited to excision of left upper eyelid chalazion including swelling, bruising, damage to surrounding structures, need for additional procedures, infection, bleeding  Recommend holding CoQ 10, multivitamin, vitamin D prior to surgery.    Electronically signed by: Carola Rhine Augustino Savastano, PA-C 09/19/2021 9:24 AM

## 2021-09-25 ENCOUNTER — Ambulatory Visit (HOSPITAL_BASED_OUTPATIENT_CLINIC_OR_DEPARTMENT_OTHER): Payer: Medicare HMO | Admitting: Anesthesiology

## 2021-09-25 ENCOUNTER — Encounter (HOSPITAL_BASED_OUTPATIENT_CLINIC_OR_DEPARTMENT_OTHER): Payer: Self-pay | Admitting: Plastic Surgery

## 2021-09-25 ENCOUNTER — Encounter (HOSPITAL_BASED_OUTPATIENT_CLINIC_OR_DEPARTMENT_OTHER)
Admission: RE | Disposition: A | Discharge status: Home / Self Care | Payer: Self-pay | Source: Home / Self Care | Attending: Plastic Surgery

## 2021-09-25 ENCOUNTER — Other Ambulatory Visit: Payer: Self-pay

## 2021-09-25 ENCOUNTER — Ambulatory Visit (HOSPITAL_BASED_OUTPATIENT_CLINIC_OR_DEPARTMENT_OTHER)
Admission: RE | Admit: 2021-09-25 | Discharge: 2021-09-25 | Disposition: A | Discharge status: Home / Self Care | Payer: Medicare HMO | Attending: Plastic Surgery | Admitting: Plastic Surgery

## 2021-09-25 DIAGNOSIS — H0014 Chalazion left upper eyelid: Secondary | ICD-10-CM | POA: Insufficient documentation

## 2021-09-25 DIAGNOSIS — D23122 Other benign neoplasm of skin of left lower eyelid, including canthus: Secondary | ICD-10-CM

## 2021-09-25 DIAGNOSIS — M609 Myositis, unspecified: Secondary | ICD-10-CM | POA: Diagnosis not present

## 2021-09-25 DIAGNOSIS — F419 Anxiety disorder, unspecified: Secondary | ICD-10-CM | POA: Insufficient documentation

## 2021-09-25 DIAGNOSIS — M797 Fibromyalgia: Secondary | ICD-10-CM | POA: Insufficient documentation

## 2021-09-25 DIAGNOSIS — I10 Essential (primary) hypertension: Secondary | ICD-10-CM | POA: Diagnosis not present

## 2021-09-25 HISTORY — DX: Other complications of anesthesia, initial encounter: T88.59XA

## 2021-09-25 HISTORY — PX: LID LESION EXCISION: SHX5204

## 2021-09-25 HISTORY — DX: Essential (primary) hypertension: I10

## 2021-09-25 SURGERY — EXCISION, LESION, EYELID
Anesthesia: General | Site: Eye | Laterality: Left

## 2021-09-25 MED ORDER — CHLORHEXIDINE GLUCONATE CLOTH 2 % EX PADS: 6.0000 | MEDICATED_PAD | Freq: Once | CUTANEOUS | Status: DC

## 2021-09-25 MED ORDER — AMISULPRIDE (ANTIEMETIC) 5 MG/2ML IV SOLN
10.0000 mg | Freq: Once | INTRAVENOUS | Status: DC | PRN
Start: 2021-09-25 — End: 2021-09-25

## 2021-09-25 MED ORDER — BUPIVACAINE-EPINEPHRINE (PF) 0.25% -1:200000 IJ SOLN
INTRAMUSCULAR | Status: DC | PRN
  Administered 2021-09-25: .5 mL

## 2021-09-25 MED ORDER — LIDOCAINE-EPINEPHRINE 1 %-1:100000 IJ SOLN: INTRAMUSCULAR | Status: DC | PRN

## 2021-09-25 MED ORDER — BUPIVACAINE-EPINEPHRINE (PF) 0.25% -1:200000 IJ SOLN
INTRAMUSCULAR | Status: AC
  Filled 2021-09-25: qty 30

## 2021-09-25 MED ORDER — FENTANYL CITRATE (PF) 100 MCG/2ML IJ SOLN
INTRAMUSCULAR | Status: AC
  Filled 2021-09-25: qty 2

## 2021-09-25 MED ORDER — LIDOCAINE HCL (CARDIAC) PF 100 MG/5ML IV SOSY
PREFILLED_SYRINGE | INTRAVENOUS | Status: DC | PRN
  Administered 2021-09-25: 100 mg via INTRAVENOUS

## 2021-09-25 MED ORDER — BACITRACIN-POLYMYXIN B 500-10000 UNIT/GM OP OINT
TOPICAL_OINTMENT | OPHTHALMIC | Status: DC | PRN
  Administered 2021-09-25: 1 via OPHTHALMIC

## 2021-09-25 MED ORDER — BSS IO SOLN
INTRAOCULAR | Status: DC | PRN
  Administered 2021-09-25: 5 mL via INTRAOCULAR

## 2021-09-25 MED ORDER — OXYCODONE HCL 5 MG/5ML PO SOLN: 5.0000 mg | Freq: Once | ORAL | Status: DC | PRN

## 2021-09-25 MED ORDER — MIDAZOLAM HCL 2 MG/2ML IJ SOLN
INTRAMUSCULAR | Status: AC
  Filled 2021-09-25: qty 2

## 2021-09-25 MED ORDER — FENTANYL CITRATE (PF) 100 MCG/2ML IJ SOLN: 25.0000 ug | INTRAMUSCULAR | Status: DC | PRN

## 2021-09-25 MED ORDER — OXYCODONE HCL 5 MG PO TABS: 5.0000 mg | ORAL_TABLET | Freq: Once | ORAL | Status: DC | PRN

## 2021-09-25 MED ORDER — ACETAMINOPHEN 500 MG PO TABS
ORAL_TABLET | ORAL | Status: AC
  Filled 2021-09-25: qty 2

## 2021-09-25 MED ORDER — EPHEDRINE 5 MG/ML INJ
INTRAVENOUS | Status: AC
  Filled 2021-09-25: qty 10

## 2021-09-25 MED ORDER — MIDAZOLAM HCL 5 MG/5ML IJ SOLN
INTRAMUSCULAR | Status: DC | PRN
  Administered 2021-09-25: 2 mg via INTRAVENOUS

## 2021-09-25 MED ORDER — CEFAZOLIN SODIUM-DEXTROSE 2-4 GM/100ML-% IV SOLN
INTRAVENOUS | Status: AC
Start: 2021-09-25 — End: ?
  Filled 2021-09-25: qty 100

## 2021-09-25 MED ORDER — ONDANSETRON HCL 4 MG/2ML IJ SOLN: 4.0000 mg | Freq: Once | INTRAMUSCULAR | Status: DC | PRN

## 2021-09-25 MED ORDER — PROPOFOL 500 MG/50ML IV EMUL
INTRAVENOUS | Status: AC
  Filled 2021-09-25: qty 50

## 2021-09-25 MED ORDER — FENTANYL CITRATE (PF) 100 MCG/2ML IJ SOLN
INTRAMUSCULAR | Status: DC | PRN
  Administered 2021-09-25: 50 ug via INTRAVENOUS

## 2021-09-25 MED ORDER — DEXAMETHASONE SODIUM PHOSPHATE 4 MG/ML IJ SOLN
INTRAMUSCULAR | Status: DC | PRN
  Administered 2021-09-25: 5 mg via INTRAVENOUS

## 2021-09-25 MED ORDER — ONDANSETRON HCL 4 MG/2ML IJ SOLN
INTRAMUSCULAR | Status: DC | PRN
  Administered 2021-09-25: 4 mg via INTRAVENOUS

## 2021-09-25 MED ORDER — CEFAZOLIN SODIUM-DEXTROSE 2-4 GM/100ML-% IV SOLN
2.0000 g | INTRAVENOUS | Status: AC
  Administered 2021-09-25: 2 g via INTRAVENOUS

## 2021-09-25 MED ORDER — ACETAMINOPHEN 500 MG PO TABS
1000.0000 mg | ORAL_TABLET | Freq: Once | ORAL | Status: AC
  Administered 2021-09-25: 1000 mg via ORAL

## 2021-09-25 MED ORDER — PROPOFOL 10 MG/ML IV BOLUS
INTRAVENOUS | Status: DC | PRN
  Administered 2021-09-25: 130 mg via INTRAVENOUS

## 2021-09-25 MED ORDER — LACTATED RINGERS IV SOLN: INTRAVENOUS | Status: DC

## 2021-09-25 MED ORDER — SULFAMETHOXAZOLE-TRIMETHOPRIM 800-160 MG PO TABS: 1.0000 | ORAL_TABLET | Freq: Two times a day (BID) | ORAL | 0 refills | Status: AC

## 2021-09-25 MED ORDER — EPHEDRINE SULFATE (PRESSORS) 50 MG/ML IJ SOLN
INTRAMUSCULAR | Status: DC | PRN
  Administered 2021-09-25: 10 mg via INTRAVENOUS

## 2021-09-25 SURGICAL SUPPLY — 52 items
ADH SKN CLS APL DERMABOND .7 (GAUZE/BANDAGES/DRESSINGS)
APL SRG 3 HI ABS STRL LF PLS (MISCELLANEOUS) ×1
APL SWBSTK 6 STRL LF DISP (MISCELLANEOUS)
APPLICATOR COTTON TIP 6 STRL (MISCELLANEOUS) ×4 IMPLANT
APPLICATOR COTTON TIP 6IN STRL (MISCELLANEOUS)
APPLICATOR DR MATTHEWS STRL (MISCELLANEOUS) IMPLANT
BLADE SURG 15 STRL LF DISP TIS (BLADE) ×2 IMPLANT
BLADE SURG 15 STRL SS (BLADE) ×1
BNDG EYE OVAL (GAUZE/BANDAGES/DRESSINGS) ×4 IMPLANT
CORD BIPOLAR FORCEPS 12FT (ELECTRODE) ×2 IMPLANT
COVER BACK TABLE 60X90IN (DRAPES) ×2 IMPLANT
COVER MAYO STAND STRL (DRAPES) ×2 IMPLANT
DERMABOND ADVANCED (GAUZE/BANDAGES/DRESSINGS)
DERMABOND ADVANCED .7 DNX12 (GAUZE/BANDAGES/DRESSINGS) IMPLANT
DRAPE U-SHAPE 76X120 STRL (DRAPES) ×2 IMPLANT
ELECT NDL BLADE 2-5/6 (NEEDLE) ×2 IMPLANT
ELECT NEEDLE BLADE 2-5/6 (NEEDLE) ×2 IMPLANT
ELECT REM PT RETURN 9FT ADLT (ELECTROSURGICAL) ×2
ELECTRODE REM PT RTRN 9FT ADLT (ELECTROSURGICAL) ×2 IMPLANT
GLOVE BIOGEL M STRL SZ7.5 (GLOVE) ×2 IMPLANT
GLOVE BIOGEL PI IND STRL 8 (GLOVE) ×2 IMPLANT
GLOVE SURG SS PI 7.5 STRL IVOR (GLOVE) IMPLANT
GOWN STRL REUS W/ TWL LRG LVL3 (GOWN DISPOSABLE) ×4 IMPLANT
GOWN STRL REUS W/TWL LRG LVL3 (GOWN DISPOSABLE) ×3
NDL HYPO 27GX1-1/4 (NEEDLE) IMPLANT
NDL HYPO 30GX1 BEV (NEEDLE) IMPLANT
NDL HYPO 30X.5 LL (NEEDLE) ×2 IMPLANT
NEEDLE HYPO 27GX1-1/4 (NEEDLE) IMPLANT
NEEDLE HYPO 30GX1 BEV (NEEDLE) ×1 IMPLANT
NEEDLE HYPO 30X.5 LL (NEEDLE) IMPLANT
PACK BASIN DAY SURGERY FS (CUSTOM PROCEDURE TRAY) ×2 IMPLANT
PENCIL SMOKE EVACUATOR (MISCELLANEOUS) ×2 IMPLANT
SHEILD EYE MED CORNL SHD 22X21 (OPHTHALMIC RELATED) ×1
SHIELD EYE MED CORNL SHD 22X21 (OPHTHALMIC RELATED) ×4 IMPLANT
SLEEVE SCD COMPRESS KNEE MED (STOCKING) IMPLANT
SPIKE FLUID TRANSFER (MISCELLANEOUS) IMPLANT
STRIP CLOSURE SKIN 1/2X4 (GAUZE/BANDAGES/DRESSINGS) ×2 IMPLANT
STRIP SUTURE WOUND CLOSURE 1/2 (MISCELLANEOUS) IMPLANT
SUCTION FRAZIER HANDLE 10FR (MISCELLANEOUS) ×1
SUCTION TUBE FRAZIER 10FR DISP (MISCELLANEOUS) IMPLANT
SUT MERSILENE 4-0 S-2 (SUTURE) ×2 IMPLANT
SUT MNCRL 6-0 UNDY P1 1X18 (SUTURE) IMPLANT
SUT MONOCRYL 6-0 P1 1X18 (SUTURE)
SUT PLAIN 5 0 P 3 18 (SUTURE) ×2 IMPLANT
SUT PROLENE 5 0 P 3 (SUTURE) ×2 IMPLANT
SUT PROLENE 6 0 P 1 18 (SUTURE) IMPLANT
SUT SILK 6 0 P 1 (SUTURE) ×4 IMPLANT
SYR 5ML LL (SYRINGE) IMPLANT
SYR CONTROL 10ML LL (SYRINGE) ×2 IMPLANT
TOWEL GREEN STERILE FF (TOWEL DISPOSABLE) ×4 IMPLANT
TRAY DSU PREP LF (CUSTOM PROCEDURE TRAY) ×2 IMPLANT
TUBE CONNECTING 20X1/4 (TUBING) IMPLANT

## 2021-09-25 NOTE — Anesthesia Postprocedure Evaluation (Signed)
Anesthesia Post Note  Patient: Madison Carr  Procedure(s) Performed: Excision of left upper eyelid chalzion, excision left lower lesion (Left: Eye)     Patient location during evaluation: PACU Anesthesia Type: General Level of consciousness: awake and alert Pain management: pain level controlled Vital Signs Assessment: post-procedure vital signs reviewed and stable Respiratory status: spontaneous breathing, nonlabored ventilation and respiratory function stable Cardiovascular status: blood pressure returned to baseline and stable Postop Assessment: no apparent nausea or vomiting Anesthetic complications: no   No notable events documented.  Last Vitals:  Vitals:   09/25/21 1100 09/25/21 1115  BP: 131/64 139/67  Pulse: 72 76  Resp: 14 16  Temp: 36.6 C 36.6 C  SpO2:  99%    Last Pain:  Vitals:   09/25/21 1030  TempSrc:   PainSc: 0-No pain                 Lidia Collum

## 2021-09-25 NOTE — Interval H&P Note (Signed)
History and Physical Interval Note:  09/25/2021 9:43 AM  Madison Carr  has presented today for surgery, with the diagnosis of Chalazion left upper eyelid, Neoplasm, uncertain whether benign or malignant.  The various methods of treatment have been discussed with the patient and family. After consideration of risks, benefits and other options for treatment, the patient has consented to  Procedure(s) with comments: Excision of left upper eyelid chalzion, excision left lower papilloma (Left) - Requesting 1-hr as a surgical intervention.  The patient's history has been reviewed, patient examined, no change in status, stable for surgery.  I have reviewed the patient's chart and labs.  Questions were answered to the patient's satisfaction.     [name]

## 2021-09-25 NOTE — Anesthesia Preprocedure Evaluation (Signed)
Anesthesia Evaluation  Patient identified by MRN, date of birth, ID band Patient awake    Reviewed: Allergy & Precautions, NPO status , Patient's Chart, lab work & pertinent test results  History of Anesthesia Complications Negative for: history of anesthetic complications  Airway Mallampati: II  TM Distance: >3 FB Neck ROM: Full    Dental  (+) Dental Advisory Given, Teeth Intact   Pulmonary neg pulmonary ROS,    Pulmonary exam normal        Cardiovascular hypertension, Normal cardiovascular exam+ dysrhythmias      Neuro/Psych Anxiety negative neurological ROS     GI/Hepatic negative GI ROS, Neg liver ROS,   Endo/Other  negative endocrine ROS  Renal/GU negative Renal ROS  negative genitourinary   Musculoskeletal  (+) Fibromyalgia -  Abdominal   Peds  Hematology negative hematology ROS (+)   Anesthesia Other Findings   Reproductive/Obstetrics                             Anesthesia Physical Anesthesia Plan  ASA: 2  Anesthesia Plan: General   Post-op Pain Management: Tylenol PO (pre-op)*   Induction: Intravenous  PONV Risk Score and Plan: 3 and Ondansetron, Dexamethasone, Midazolam and Treatment may vary due to age or medical condition  Airway Management Planned: LMA  Additional Equipment: None  Intra-op Plan:   Post-operative Plan: Extubation in OR  Informed Consent: I have reviewed the patients History and Physical, chart, labs and discussed the procedure including the risks, benefits and alternatives for the proposed anesthesia with the patient or authorized representative who has indicated his/her understanding and acceptance.     Dental advisory given  Plan Discussed with:   Anesthesia Plan Comments:         Anesthesia Quick Evaluation

## 2021-09-25 NOTE — Discharge Instructions (Addendum)
Activity as tolerated. You can shower in 24 hours. NO driving No heavy activities   Diet: Regular   Wound Care: Apply ice to eye incisions/eye lid multiple times per day, this will help with swelling and bruising. Sleep in a reclined position for the first 3 days to assist with swelling. Use artificial tears from [facility], etc as needed for dry eye.   Pain control: Tylenol for pain control as needed, take your oxycodone as needed for pain control as well. Avoid more than 3,000 mg of tylenol per day. Norco has 325 mg of tylenol per tablet. Avoid ibuprofen/aleve/naproxen for the first 24 hours to prevent increased bruising  An antibiotic Bactrim - Trimethoprim/Sulfamethoxazole was called into your pharmacy today, please take for 7 days.   Call Doctor if any unusual problems occur such as pain, excessive Bleeding, unrelieved Nausea/vomiting, Fever &/or chills   Follow-up appointment: Scheduled for next week.   Post Anesthesia Home Care Instructions  Activity: Get plenty of rest for the remainder of the day. A responsible individual must stay with you for 24 hours following the procedure.  For the next 24 hours, DO NOT: -Drive a car -Paediatric nurse -Drink alcoholic beverages -Take any medication unless instructed by your physician -Make any legal decisions or sign important papers.  Meals: Start with liquid foods such as gelatin or soup. Progress to regular foods as tolerated. Avoid greasy, spicy, heavy foods. If nausea and/or vomiting occur, drink only clear liquids until the nausea and/or vomiting subsides. Call your physician if vomiting continues.  Special Instructions/Symptoms: Your throat may feel dry or sore from the anesthesia or the breathing tube placed in your throat during surgery. If this causes discomfort, gargle with warm salt water. The discomfort should disappear within 24 hours.  If you had a scopolamine patch placed behind your ear for the management of  post- operative nausea and/or vomiting:  1. The medication in the patch is effective for 72 hours, after which it should be removed.  Wrap patch in a tissue and discard in the trash. Wash hands thoroughly with soap and water. 2. You may remove the patch earlier than 72 hours if you experience unpleasant side effects which may include dry mouth, dizziness or visual disturbances. 3. Avoid touching the patch. Wash your hands with soap and water after contact with the patch.

## 2021-09-25 NOTE — Anesthesia Procedure Notes (Signed)
Procedure Name: LMA Insertion Date/Time: 09/25/2021 10:06 AM  Performed by: Tawni Millers, CRNAPre-anesthesia Checklist: Patient identified, Emergency Drugs available, Suction available and Patient being monitored Patient Re-evaluated:Patient Re-evaluated prior to induction Oxygen Delivery Method: Circle system utilized Preoxygenation: Pre-oxygenation with 100% oxygen Induction Type: IV induction Ventilation: Mask ventilation without difficulty LMA: LMA inserted LMA Size: 4.0 Number of attempts: 1 Airway Equipment and Method: Bite block Placement Confirmation: positive ETCO2 Tube secured with: Tape Dental Injury: Teeth and Oropharynx as per pre-operative assessment

## 2021-09-25 NOTE — Transfer of Care (Signed)
Immediate Anesthesia Transfer of Care Note  Patient: Madison Carr  Procedure(s) Performed: Excision of left upper eyelid chalzion, excision left lower lesion (Left: Eye)  Patient Location: PACU  Anesthesia Type:General  Level of Consciousness: awake  Airway & Oxygen Therapy: Patient Spontanous Breathing and Patient connected to nasal cannula oxygen  Post-op Assessment: Report given to RN and Post -op Vital signs reviewed and stable  Post vital signs: Reviewed and stable  Last Vitals:  Vitals Value Taken Time  BP    Temp    Pulse    Resp    SpO2      Last Pain:  Vitals:   09/25/21 0912  TempSrc: Oral  PainSc: 0-No pain         Complications: No notable events documented.

## 2021-09-25 NOTE — Op Note (Signed)
Operative Note   DATE OF OPERATION: 09/25/2021  SURGICAL DEPARTMENT: Plastic Surgery  PREOPERATIVE DIAGNOSES:   1) Left lower eyelid papilloma 2) left upper eyelid chalazion  POSTOPERATIVE DIAGNOSES:  same  PROCEDURE:   1) Shave excision of left lower eyelid papilloma, 2 mm 2) Transcutaneous excision of left upper eyelid chalazion  SURGEON: Markeis Allman P. Tyger Wichman, MD  ASSISTANT: Verdie Shire, PAC  ANESTHESIA:  General.   COMPLICATIONS: None.   INDICATIONS FOR PROCEDURE:  The patient, Madison Carr is a 71 y.o. female born on 1950/05/27, is here for treatment of left upper and lower eyelid lesions. MRN: [id number]  CONSENT:  Informed consent was obtained directly from the patient. Risks, benefits and alternatives were fully discussed. Specific risks including but not limited to bleeding, infection, hematoma, seroma, scarring, pain, contracture, asymmetry, wound healing problems, and need for further surgery were all discussed. The patient did have an ample opportunity to have questions answered to satisfaction.   DESCRIPTION OF PROCEDURE:  The patient was taken to the operating room. SCDs were placed and preoperative antibiotics were given. General anesthesia was administered.  The patient's operative site was prepped and draped in a sterile fashion. A time out was performed and all information was confirmed to be correct.    The left upper and lower eyelid were prepped with ophthalmic Betadine.  The eye was irrigated and a corneal protector was placed.  Quarter percent Marcaine with epinephrine was injected in the upper and lower eyelid at the site of surgery.  There attention was first turned to the lower eyelid lesion.  This was shaved with a 15 blade.  This was sent to pathology.  Attention was then turned to the left upper eyelid lesion.  Patient had a chalazion on her left upper eyelid that was pointing at the skin but was not visible on the conjunctival side.  Made a 3 mm excision  with a 15 blade and dissected with Wescott scissors.  A small amount of material was removed from the cystic lesion.  After confirming hemostasis this was closed with a single interrupted plain gut suture.  The advanced practice practitioner (APP) assisted throughout the case.  The APP was essential in retraction and counter traction when needed to make the case progress smoothly.  This retraction and assistance made it possible to see the tissue planes for the procedure.  The assistance was needed for hemostasis, tissue re-approximation and closure of the incision site.    The patient tolerated the procedure well.  There were no complications. The patient was allowed to wake from anesthesia, extubated and taken to the recovery room in satisfactory condition.

## 2021-09-26 ENCOUNTER — Encounter: Payer: Self-pay | Admitting: Physician Assistant

## 2021-09-26 ENCOUNTER — Ambulatory Visit (INDEPENDENT_AMBULATORY_CARE_PROVIDER_SITE_OTHER): Payer: Medicare HMO | Admitting: Physician Assistant

## 2021-09-26 DIAGNOSIS — F411 Generalized anxiety disorder: Secondary | ICD-10-CM

## 2021-09-26 DIAGNOSIS — G4700 Insomnia, unspecified: Secondary | ICD-10-CM | POA: Diagnosis not present

## 2021-09-26 MED ORDER — FLUVOXAMINE MALEATE 100 MG PO TABS
150.0000 mg | ORAL_TABLET | Freq: Every day | ORAL | 1 refills | Status: DC
Start: 2021-09-26 — End: 2021-11-05

## 2021-09-26 MED ORDER — LORAZEPAM 1 MG PO TABS
1.0000 mg | ORAL_TABLET | Freq: Every day | ORAL | 1 refills | Status: DC
Start: 2021-09-26 — End: 2021-11-05

## 2021-09-26 NOTE — Progress Notes (Signed)
[facility]  Patient ID: CABELLA KIMM,  MRN: [id number]  PCP: System, Provider Not In  Date of Evaluation: 09/26/2021 Time spent:20 minutes  Chief Complaint:  Chief Complaint   Anxiety; Depression; Follow-up    HISTORY/CURRENT STATUS: HPI for 6-week med check.  About 75% better since increasing the Luvox a month ago. Is not as anxious.  States her credit card had 2 big charges from [place] that fortunately she was able to catch over this past weekend, called and account has had to be closed, she is getting a new card sent to her.  But that caused some anxiety of course.  States she did not freak out as bad as she normally would so feels that the medicine is helping a lot.  She wonders if it would help even more if we increased the dose though.  Still very active.  She plays tennis, helps take care of her granddaughters when needed and enjoys that.  Energy and motivation are good.   No extreme sadness, tearfulness, or feelings of hopelessness.  Sleeps well most of the time. ADLs and personal hygiene are normal.   Denies any changes in concentration, making decisions, or remembering things.  Appetite has not changed.  Weight is stable.  Denies suicidal or homicidal thoughts.  Anxiety is well controlled.  She does not take the Ativan during the day at all.  Only takes it approximately 11 PM so she can relax to go to sleep.  If she does not take it she has ruminating thoughts and has a hard time falling asleep.  Once she gets to sleep she sleeps all night.  Denies dizziness, syncope, seizures, numbness, tingling, tremor, tics, unsteady gait, slurred speech, confusion. Denies muscle or joint pain, stiffness, or dystonia.  Individual Medical History/ Review of Systems: Changes? :Yes   left eye surgery, chalazion removed yesterday.   Allergies: Patient has no known allergies.  Current Medications:  Current Outpatient Medications:    amitriptyline (ELAVIL) 10 MG tablet, Take 10  mg by mouth at bedtime., Disp: , Rfl:    Ascorbic Acid (VITAMIN C) 500 MG CAPS, Take 1 capsule by mouth daily., Disp: , Rfl:    Calcium Carbonate-Vitamin D3 600-400 MG-UNIT TABS, 1 tablet with food, Disp: , Rfl:    CALCIUM-MAGNESIUM-ZINC PO, Take by mouth. Calcium '1000mg'$  Magnesium '400mg'$   Zinc '25mg'$ , Disp: , Rfl:    cholecalciferol (VITAMIN D) 1000 UNITS tablet, Take 1,000 Units by mouth daily., Disp: , Rfl:    Coenzyme Q10 50 MG CAPS, Take by mouth., Disp: , Rfl:    hydrochlorothiazide (HYDRODIURIL) 12.5 MG tablet, Take 1 tablet (12.5 mg total) by mouth daily., Disp: 90 tablet, Rfl: 1   Multiple Vitamins-Minerals (OCUVITE PO), See admin instructions., Disp: , Rfl:    Multiple Vitamins-Minerals (ZINC PO), Take 1 tablet by mouth daily., Disp: , Rfl:    sulfamethoxazole-trimethoprim (BACTRIM DS) 800-160 MG tablet, Take 1 tablet by mouth 2 (two) times daily for 7 days., Disp: 14 tablet, Rfl: 0   Estradiol (YUVAFEM) 10 MCG TABS vaginal tablet, Place 1 tablet (10 mcg total) vaginally 2 (two) times a week., Disp: 24 tablet, Rfl: 1   fluvoxaMINE (LUVOX) 100 MG tablet, Take 1.5 tablets (150 mg total) by mouth at bedtime., Disp: 45 tablet, Rfl: 1   LORazepam (ATIVAN) 1 MG tablet, Take 1 tablet (1 mg total) by mouth at bedtime., Disp: 30 tablet, Rfl: 1   oxyCODONE (OXY IR/ROXICODONE) 5 MG immediate release tablet, Take 1 tablet (5 mg  total) by mouth every 6 (six) hours as needed for up to 8 doses for severe pain. (Patient not taking: Reported on 09/26/2021), Disp: 8 tablet, Rfl: 0   SHINGRIX injection, , Disp: , Rfl:  Medication Side Effects: none  Family Medical/ Social History: Changes? No  MENTAL HEALTH EXAM:  Last menstrual period 11/22/2003.There is no height or weight on file to calculate BMI.  General Appearance: Casual, Well Groomed, and mild ecchymosis of lateral left upper eyelid  Eye Contact:  Good  Speech:  Clear and Coherent and Normal Rate  Volume:  Normal  Mood:  Euthymic  Affect:   Congruent  Thought Process:  Goal Directed and Descriptions of Associations: Circumstantial  Orientation:  Full (Time, Place, and Person)  Thought Content: Logical   Suicidal Thoughts:  No  Homicidal Thoughts:  No  Memory:  Immediate;   Fair Recent;   Fair Remote;   Good  Judgement:  Good  Insight:  Good  Psychomotor Activity:  Normal  Concentration:  Concentration: Good  Recall:  Good  Fund of Knowledge: Good  Language: Good  Assets:  Desire for Improvement Financial Resources/Insurance Housing Transportation  ADL's:  Intact  Cognition: WNL  Prognosis:  Good   DIAGNOSES:    ICD-10-CM   1. Generalized anxiety disorder  F41.1     2. Insomnia, unspecified type  G47.00       Receiving Psychotherapy: Yes  with Evelena Peat Deussing  RECOMMENDATIONS:  PDMP reviewed.  Ativan filled 09/20/2021. I provided 20 minutes of face to face time during this encounter, including time spent before and after the visit in records review, medical decision making, counseling pertinent to today's visit, and charting.   She is responding very well to the Luvox.  Recommend increasing the dose, she would like to do that.  We briefly discussed Gene sight testing.  She will do some research on it.  I explained the pros and cons of it, since she is responding to the Luvox I do not think it is absolutely necessary to do the test at this point but if she is not responding at any time or if she would like to go ahead and have the test done she can let me know.  Her daughter is a Music therapist so she will discuss it with her as well.  Continue amitriptyline 10 mg, 1 p.o. nightly. Increase Luvox to 100 mg, 1.5 pills nightly. Continue Ativan 1 mg, 1 p.o. nightly as needed. Continue multivitamin, vitamin D, fish oil, B12, CoQ 10 Continue therapy with Evelena Peat Deussing. Return in 6 weeks.  (The electricity went out at the office during our appointment.  Therefore she was not able to check out and make an  appointment.  She will call back to set that up.)  Donnal Moat, PA-C

## 2021-09-27 LAB — SURGICAL PATHOLOGY

## 2021-10-04 ENCOUNTER — Ambulatory Visit (INDEPENDENT_AMBULATORY_CARE_PROVIDER_SITE_OTHER): Payer: Medicare HMO | Admitting: Surgical

## 2021-10-04 DIAGNOSIS — H0014 Chalazion left upper eyelid: Secondary | ICD-10-CM

## 2021-10-04 NOTE — Progress Notes (Signed)
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  She is 9 days postop.  Pathology was benign.  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  Patient is doing really well, she is very pleased.  She reports recovery has been much better than expected.  She is not having any issues.  She would like to return to playing tennis.  She has some questions about wearing eye make-up.  On exam left upper eyelid incision is intact, healing well.  Chromic sutures noted.  There is no erythema or cellulitic changes.  No swelling or ecchymosis noted of left upper or lower eyelid.  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  Pathology was reviewed.  She can begin using eye make-up in a couple days.  Chromic suture was removed, patient tolerated this well.  There is no signs of infection on exam.  She can begin playing tennis.  Recommend following up as needed, call with questions or concerns.

## 2021-10-30 ENCOUNTER — Ambulatory Visit (INDEPENDENT_AMBULATORY_CARE_PROVIDER_SITE_OTHER): Payer: Medicare HMO | Admitting: Surgical

## 2021-10-30 ENCOUNTER — Encounter: Payer: Self-pay | Admitting: Surgical

## 2021-10-30 DIAGNOSIS — H0014 Chalazion left upper eyelid: Secondary | ICD-10-CM | POA: Diagnosis not present

## 2021-10-30 NOTE — Addendum Note (Signed)
Addended byRoetta Sessions on: 10/30/2021 11:12 AM   Modules accepted: Orders, Level of Service

## 2021-10-30 NOTE — Progress Notes (Signed)
   Referring Provider No referring provider defined for this encounter.   CC:  Chief Complaint  Patient presents with   Post-op Follow-up      Madison Carr is an 71 y.o. female.  HPI: Patient is a 71 year old female here for follow-up after shave excision of left lower eyelid papilloma and transcutaneous excision of left upper eyelid chalazion with Dr. Erin Hearing on 09/25/2021.  Patient reports that she feels as if the left upper eyelid chalazion has returned.  She reports otherwise she is doing well.  Review of Systems General: No fevers or chills  Physical Exam    09/25/2021   11:15 AM 09/25/2021   11:00 AM 09/25/2021   10:45 AM  Vitals with BMI  Systolic 553 748 270  Diastolic 67 64 60  Pulse 76 72 79    General:  No acute distress,  Alert and oriented, Non-Toxic, Normal speech and affect On exam left upper eyelid chalazion is present, it is approximately 1 x 1.5 mm.  There is no surrounding erythema or cellulitic changes.  Assessment/Plan  Discussed with patient that Dr. Erin Hearing will be moving effective November 21, 2021, discussed with her we would need to likely refer her to an oculoplastic surgeon in the area.  Will refer patient to Dr. Leonard Schwartz.    Madison Carr 10/30/2021, 10:52 AM

## 2021-10-31 ENCOUNTER — Other Ambulatory Visit: Payer: Self-pay

## 2021-10-31 DIAGNOSIS — H0014 Chalazion left upper eyelid: Secondary | ICD-10-CM

## 2021-11-05 ENCOUNTER — Encounter: Payer: Self-pay | Admitting: Physician Assistant

## 2021-11-05 ENCOUNTER — Ambulatory Visit (INDEPENDENT_AMBULATORY_CARE_PROVIDER_SITE_OTHER): Payer: Medicare HMO | Admitting: Physician Assistant

## 2021-11-05 DIAGNOSIS — F411 Generalized anxiety disorder: Secondary | ICD-10-CM

## 2021-11-05 DIAGNOSIS — F329 Major depressive disorder, single episode, unspecified: Secondary | ICD-10-CM

## 2021-11-05 DIAGNOSIS — G4700 Insomnia, unspecified: Secondary | ICD-10-CM

## 2021-11-05 MED ORDER — LORAZEPAM 1 MG PO TABS
1.0000 mg | ORAL_TABLET | Freq: Every day | ORAL | 1 refills | Status: DC
Start: 2021-11-05 — End: 2021-11-29

## 2021-11-05 MED ORDER — FLUVOXAMINE MALEATE 100 MG PO TABS: 200.0000 mg | ORAL_TABLET | Freq: Every day | ORAL | 1 refills | Status: DC

## 2021-11-05 NOTE — Progress Notes (Signed)
[facility]  Patient ID: Madison Carr,  MRN: [id number]  PCP: System, Provider Not In  Date of Evaluation: 11/05/2021 Time spent:20 minutes  Chief Complaint:  Chief Complaint   Anxiety; Depression; Follow-up    HISTORY/CURRENT STATUS: HPI for 6-week med check.  We increased Luvox at the last visit.  It has helped somewhat.  She has had some stressors, 2 of her husbands friends have both recently passed away.  And then one of her good friends daughter who is 38 years old has a brain tumor.  So she has had a lot on her plate.  She is exercising and playing tennis which helps her feel better.  Thinks she could feel even better though.  Energy and motivation are low but sometimes are normal.  It is hard to say.  She is sleeping pretty well.  Anxiety is still a problem at times, not having panic attacks so much as generalized sense of unease, like something bad may happen.  Ativan does help.  ADLs and personal hygiene are normal.  Appetite has decreased some but she has not lost weight.  No suicidal or homicidal thoughts.  Patient denies increased energy with decreased need for sleep, increased talkativeness, racing thoughts, impulsivity or risky behaviors, increased spending, increased libido, grandiosity, increased irritability or anger, paranoia, or hallucinations.  Denies dizziness, syncope, seizures, numbness, tingling, tremor, tics, unsteady gait, slurred speech, confusion. Denies muscle or joint pain, stiffness, or dystonia.  Individual Medical History/ Review of Systems: Changes? :No       Past Psychiatric History:    No mental health hospitalizations. No suicide attempts. No self harm.    Past medications for mental health diagnoses include: Elavil, Ativan, Trazodone only for a little while, was not effective.  Allergies: Patient has no known allergies.  Current Medications:  Current Outpatient Medications:    Ascorbic Acid (VITAMIN C) 500 MG CAPS, Take 1  capsule by mouth daily., Disp: , Rfl:    Calcium Carbonate-Vitamin D3 600-400 MG-UNIT TABS, 1 tablet with food, Disp: , Rfl:    CALCIUM-MAGNESIUM-ZINC PO, Take by mouth. Calcium '1000mg'$  Magnesium '400mg'$   Zinc '25mg'$ , Disp: , Rfl:    cholecalciferol (VITAMIN D) 1000 UNITS tablet, Take 1,000 Units by mouth daily., Disp: , Rfl:    Coenzyme Q10 50 MG CAPS, Take by mouth., Disp: , Rfl:    Estradiol (YUVAFEM) 10 MCG TABS vaginal tablet, Place 1 tablet (10 mcg total) vaginally 2 (two) times a week., Disp: 24 tablet, Rfl: 1   hydrochlorothiazide (HYDRODIURIL) 12.5 MG tablet, Take 1 tablet (12.5 mg total) by mouth daily., Disp: 90 tablet, Rfl: 1   Multiple Vitamins-Minerals (OCUVITE PO), See admin instructions., Disp: , Rfl:    Multiple Vitamins-Minerals (ZINC PO), Take 1 tablet by mouth daily., Disp: , Rfl:    amitriptyline (ELAVIL) 10 MG tablet, Take 10 mg by mouth at bedtime. (Patient not taking: Reported on 11/05/2021), Disp: , Rfl:    fluvoxaMINE (LUVOX) 100 MG tablet, Take 2 tablets (200 mg total) by mouth at bedtime., Disp: 180 tablet, Rfl: 1   LORazepam (ATIVAN) 1 MG tablet, Take 1 tablet (1 mg total) by mouth at bedtime., Disp: 30 tablet, Rfl: 1   SHINGRIX injection, , Disp: , Rfl:  Medication Side Effects: none  Family Medical/ Social History: Changes? No  MENTAL HEALTH EXAM:  Last menstrual period 11/22/2003.There is no height or weight on file to calculate BMI.  General Appearance: Casual, Well Groomed, and mild ecchymosis of lateral left upper eyelid  Eye Contact:  Good  Speech:  Clear and Coherent and Normal Rate  Volume:  Normal  Mood:  Depressed  Affect:  Depressed  Thought Process:  Goal Directed and Descriptions of Associations: Circumstantial  Orientation:  Full (Time, Place, and Person)  Thought Content: Logical   Suicidal Thoughts:  No  Homicidal Thoughts:  No  Memory:  Immediate;   Fair Recent;   Fair Remote;   Good  Judgement:  Good  Insight:  Good  Psychomotor Activity:   Normal  Concentration:  Concentration: Good  Recall:  Good  Fund of Knowledge: Good  Language: Good  Assets:  Desire for Improvement Financial Resources/Insurance Housing Transportation  ADL's:  Intact  Cognition: WNL  Prognosis:  Good   DIAGNOSES:    ICD-10-CM   1. Generalized anxiety disorder  F41.1     2. Insomnia, unspecified type  G47.00     3. 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  Ativan filled 10/25/2021.  I provided 20 minutes of face to face time during this encounter, including time spent before and after the visit in records review, medical decision making, counseling pertinent to today's visit, and charting.   Recommend increasing Luvox again.  Pros and cons discussed.  Continue amitriptyline 10 mg, 1 p.o. nightly. Increase Luvox 100 mg to 2 p.o. nightly. Continue Ativan 1 mg, 1 p.o. nightly as needed. Continue multivitamin, vitamin D, fish oil, B12, CoQ 10. Continue therapy with Evelena Peat Deussing. Return in 6 weeks.   Donnal Moat, PA-C

## 2021-11-07 ENCOUNTER — Ambulatory Visit: Payer: Medicare HMO | Admitting: Physician Assistant

## 2021-11-28 ENCOUNTER — Other Ambulatory Visit: Payer: Self-pay | Admitting: Physician Assistant

## 2021-12-17 ENCOUNTER — Encounter: Payer: Self-pay | Admitting: Physician Assistant

## 2021-12-17 ENCOUNTER — Ambulatory Visit: Payer: Medicare HMO | Admitting: Physician Assistant

## 2021-12-17 DIAGNOSIS — F411 Generalized anxiety disorder: Secondary | ICD-10-CM | POA: Diagnosis not present

## 2021-12-17 DIAGNOSIS — F329 Major depressive disorder, single episode, unspecified: Secondary | ICD-10-CM | POA: Diagnosis not present

## 2021-12-17 DIAGNOSIS — G4700 Insomnia, unspecified: Secondary | ICD-10-CM

## 2021-12-17 DIAGNOSIS — R413 Other amnesia: Secondary | ICD-10-CM | POA: Diagnosis not present

## 2021-12-17 MED ORDER — AMITRIPTYLINE HCL 10 MG PO TABS: 10.0000 mg | ORAL_TABLET | Freq: Every day | ORAL | 0 refills | Status: DC

## 2021-12-17 MED ORDER — LORAZEPAM 1 MG PO TABS: 1.0000 mg | ORAL_TABLET | Freq: Every day | ORAL | 2 refills | Status: DC

## 2021-12-17 NOTE — Progress Notes (Signed)
[facility]  Patient ID: Madison Carr,  MRN: [id number]  PCP: System, Provider Not In  Date of Evaluation: 12/17/2021 Time spent:20 minutes  Chief Complaint:  Chief Complaint   Anxiety; Depression; Follow-up    HISTORY/CURRENT STATUS: HPI for 6-week med check.  We increased the Luvox again at her last visit.  States she feels quite a bit better.  Is sleeping well now.  Not having anxiety like she did, with the "knot" in the pit of her stomach.  She is still taking the Ativan, only in the evening.  It helps her relax if she can go to sleep.  Her mind races and she does not take it.  Not having panic attacks.  She does feel overwhelmed sometimes, but that feeling passes quicker now.  Patient is able to enjoy things.  Energy and motivation are good.   No extreme sadness, tearfulness, or feelings of hopelessness.  ADLs and personal hygiene are normal.  Her memory is still poor.  Does not feel like it is any worse though.  She forgets people's names or she may lose her keys for example.  Nothing major, it just concerns her a little bit.  States her friends report similar symptoms.  "It may just be an age-related thing.".  Appetite has not changed.  Weight is stable.  Denies suicidal or homicidal thoughts.  Patient denies increased energy with decreased need for sleep, increased talkativeness, racing thoughts, impulsivity or risky behaviors, increased spending, increased libido, grandiosity, increased irritability or anger, paranoia, or hallucinations.  Denies dizziness, syncope, seizures, numbness, tingling, tremor, tics, unsteady gait, slurred speech, confusion. Denies muscle or joint pain, stiffness, or dystonia.  Individual Medical History/ Review of Systems: Changes? :No       Past Psychiatric History:    No mental health hospitalizations. No suicide attempts. No self harm.    Past medications for mental health diagnoses include: Elavil, Ativan, Trazodone only for a  little while, was not effective.  Allergies: Patient has no known allergies.  Current Medications:  Current Outpatient Medications:    Ascorbic Acid (VITAMIN C) 500 MG CAPS, Take 1 capsule by mouth daily., Disp: , Rfl:    Calcium Carbonate-Vitamin D3 600-400 MG-UNIT TABS, 1 tablet with food, Disp: , Rfl:    CALCIUM-MAGNESIUM-ZINC PO, Take by mouth. Calcium '1000mg'$  Magnesium '400mg'$   Zinc '25mg'$ , Disp: , Rfl:    cholecalciferol (VITAMIN D) 1000 UNITS tablet, Take 1,000 Units by mouth daily., Disp: , Rfl:    Coenzyme Q10 50 MG CAPS, Take by mouth., Disp: , Rfl:    Estradiol (YUVAFEM) 10 MCG TABS vaginal tablet, Place 1 tablet (10 mcg total) vaginally 2 (two) times a week., Disp: 24 tablet, Rfl: 1   fluvoxaMINE (LUVOX) 100 MG tablet, Take 2 tablets (200 mg total) by mouth at bedtime., Disp: 180 tablet, Rfl: 1   hydrochlorothiazide (HYDRODIURIL) 12.5 MG tablet, Take 1 tablet (12.5 mg total) by mouth daily., Disp: 90 tablet, Rfl: 1   Multiple Vitamins-Minerals (OCUVITE PO), See admin instructions., Disp: , Rfl:    Multiple Vitamins-Minerals (ZINC PO), Take 1 tablet by mouth daily., Disp: , Rfl:    amitriptyline (ELAVIL) 10 MG tablet, Take 1 tablet (10 mg total) by mouth at bedtime., Disp: 90 tablet, Rfl: 0   LORazepam (ATIVAN) 1 MG tablet, Take 1 tablet (1 mg total) by mouth at bedtime., Disp: 30 tablet, Rfl: 2   SHINGRIX injection, , Disp: , Rfl:  Medication Side Effects: none  Family Medical/ Social History: Changes?  No  MENTAL HEALTH EXAM:  Last menstrual period 11/22/2003.There is no height or weight on file to calculate BMI.  General Appearance: Casual and Well Groomed  Eye Contact:  Good  Speech:  Clear and Coherent and Normal Rate  Volume:  Normal  Mood:  Euthymic  Affect:  Congruent  Thought Process:  Goal Directed and Descriptions of Associations: Circumstantial  Orientation:  Full (Time, Place, and Person)  Thought Content: Logical   Suicidal Thoughts:  No  Homicidal Thoughts:  No   Memory:  Immediate;   Fair Recent;   Fair Remote;   Good  Judgement:  Good  Insight:  Good  Psychomotor Activity:  Normal  Concentration:  Concentration: Good  Recall:  Good  Fund of Knowledge: Good  Language: Good  Assets:  Desire for Improvement Financial Resources/Insurance Housing Transportation  ADL's:  Intact  Cognition: WNL  Prognosis:  Good   DIAGNOSES:    ICD-10-CM   1. Generalized anxiety disorder  F41.1     2. 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 Insomnia, unspecified type  G47.00     4. Memory loss  R41.3      Receiving Psychotherapy: Yes  with Evelena Peat Deussing  RECOMMENDATIONS:  PDMP reviewed.  Ativan filled 11/29/2021.   I provided 20 minutes of face to face time during this encounter, including time spent before and after the visit in records review, medical decision making, counseling pertinent to today's visit, and charting.   She is doing well so no changes will be made in her medications. We did discuss the memory issues.  I have hoped that once the anxiety and depression were better treated she may notice an improvement in her memory.  That has not been the case.  The next step would be to refer to neurology.  She would like to hold off on that until after the first of the year, if she chooses to do it at all.  She thinks her memory issues may be age related, and she would like to run this by her husband before she decides whether to see a neurologist or not.  She can call and let me know at any time and I will get the referral process going.  Continue amitriptyline 10 mg, 1 p.o. nightly. Continue Luvox 100 mg 2 p.o. nightly. Continue Ativan 1 mg, 1 p.o. nightly as needed. Continue multivitamin, vitamin D, fish oil, B12, CoQ 10. Continue therapy with Evelena Peat Deussing. Return in 3 months.  Donnal Moat, PA-C

## 2022-01-23 DIAGNOSIS — L820 Inflamed seborrheic keratosis: Secondary | ICD-10-CM | POA: Diagnosis not present

## 2022-01-23 DIAGNOSIS — L570 Actinic keratosis: Secondary | ICD-10-CM | POA: Diagnosis not present

## 2022-01-24 DIAGNOSIS — H0011 Chalazion right upper eyelid: Secondary | ICD-10-CM | POA: Diagnosis not present

## 2022-01-28 ENCOUNTER — Ambulatory Visit (INDEPENDENT_AMBULATORY_CARE_PROVIDER_SITE_OTHER): Payer: Medicare HMO | Admitting: Obstetrics & Gynecology

## 2022-01-28 ENCOUNTER — Encounter (HOSPITAL_BASED_OUTPATIENT_CLINIC_OR_DEPARTMENT_OTHER): Payer: Self-pay | Admitting: Obstetrics & Gynecology

## 2022-01-28 ENCOUNTER — Other Ambulatory Visit (HOSPITAL_COMMUNITY)
Admission: RE | Admit: 2022-01-28 | Discharge: 2022-01-28 | Disposition: A | Discharge status: Home / Self Care | Payer: Medicare HMO | Source: Ambulatory Visit | Attending: Obstetrics & Gynecology | Admitting: Obstetrics & Gynecology

## 2022-01-28 VITALS — BP 134/69 | HR 72 | Ht 67.0 in | Wt 142.2 lb

## 2022-01-28 DIAGNOSIS — R87612 Low grade squamous intraepithelial lesion on cytologic smear of cervix (LGSIL): Secondary | ICD-10-CM | POA: Diagnosis present

## 2022-01-28 DIAGNOSIS — M8580 Other specified disorders of bone density and structure, unspecified site: Secondary | ICD-10-CM

## 2022-01-28 DIAGNOSIS — Z124 Encounter for screening for malignant neoplasm of cervix: Secondary | ICD-10-CM | POA: Insufficient documentation

## 2022-01-28 DIAGNOSIS — N952 Postmenopausal atrophic vaginitis: Secondary | ICD-10-CM | POA: Diagnosis not present

## 2022-01-28 DIAGNOSIS — Z01419 Encounter for gynecological examination (general) (routine) without abnormal findings: Secondary | ICD-10-CM | POA: Diagnosis not present

## 2022-01-28 MED ORDER — ESTRADIOL 10 MCG VA TABS: 1.0000 | ORAL_TABLET | VAGINAL | 4 refills | Status: DC

## 2022-01-28 NOTE — Progress Notes (Signed)
72 y.o. [id number] Married White or Caucasian female here for breast and pelvic exam.  I am also following her for h/o LGSIL pap smear and vaginal atrophy.  Had colposcopy last year 06/2020.  Denies vaginal bleeding.    Reports some more recent anxiety.  Has started Luvox.    Patient's last menstrual period was 11/22/2003 (approximate).          Sexually active: Yes.    H/O STD:  no  Health Maintenance: PCP:  Mellody Dance.  Will establish care this year. Vaccines are up to date:  has not done Prevnar 20 Colonoscopy:  2020, follow up 5 years MMG:  05/2021 BMD:  osteopenia, 2020.  Will order another one this year. Last pap smear:  04/2020.   H/o abnormal pap smear:  LGSIL    reports that she has never smoked. She has never used smokeless tobacco. She reports current alcohol use of about 7.0 standard drinks of alcohol per week. She reports that she does not use drugs.  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   Fibromyalgia    History of hemorrhoidectomy 01/27/2019   Hypertension    Insomnia    Paroxysmal tachycardia (HCC)    Postmenopausal atrophic vaginitis 05/2007   Squamous cell carcinoma of skin 09/22/2019   in situ- mid frontal scalp ([id number])    Past Surgical History:  Procedure Laterality Date   APPENDECTOMY  08/25/2015   With colectomy   BASAL CELL CARCINOMA EXCISION  2008 ?   nose   BLEPHAROPLASTY  2004   COLPOSCOPY W/ BIOPSY / CURETTAGE  07/11/2008   benign with focal koilocytic atypia, followup remained ASCUS with neg. HR HPV through 2013   ENDOMETRIAL ABLATION  2005   HEMORROIDECTOMY  01/27/2019   LAPAROSCOPIC RIGHT COLECTOMY N/A 08/25/2015   Procedure: Diagnostic LAPAROSCOPY;  Surgeon: Jackolyn Confer, MD;  Location: [facility];  Service: General;   Laterality: N/A;   LID LESION EXCISION Left 09/25/2021   Procedure: Excision of left upper eyelid chalzion, excision left lower lesion;  Surgeon: [name], MD;  Location: [facility];  Service: Plastics;  Laterality: Left;  Requesting 1-hr   PARTIAL COLECTOMY  08/25/2015   Procedure: Extended right COLECTOMY;  Surgeon: Jackolyn Confer, MD;  Location: [facility];  Service: General;;   [place]    Current Outpatient Medications  Medication Sig Dispense Refill   amitriptyline (ELAVIL) 10 MG tablet Take 1 tablet (10 mg total) by mouth at bedtime. 90 tablet 0   Ascorbic Acid (VITAMIN C) 500 MG CAPS Take 1 capsule by mouth daily.     Calcium Carbonate-Vitamin D3 600-400 MG-UNIT TABS 1 tablet with food     CALCIUM-MAGNESIUM-ZINC PO Take by mouth. Calcium '1000mg'$  Magnesium '400mg'$   Zinc '25mg'$      cholecalciferol (VITAMIN D) 1000 UNITS tablet Take 1,000 Units by mouth daily.     Estradiol (YUVAFEM) 10 MCG TABS vaginal tablet Place 1 tablet (10 mcg total) vaginally 2 (two) times a week. 24 tablet 1   fluvoxaMINE (LUVOX) 100 MG tablet Take 2 tablets (200 mg total) by mouth at bedtime. 180 tablet 1   hydrochlorothiazide (HYDRODIURIL) 12.5 MG tablet Take 1 tablet (12.5 mg total) by mouth  daily. 90 tablet 1   LORazepam (ATIVAN) 1 MG tablet Take 1 tablet (1 mg total) by mouth at bedtime. 30 tablet 2   Multiple Vitamins-Minerals (OCUVITE PO) See admin instructions.     Multiple Vitamins-Minerals (ZINC PO) Take 1 tablet by mouth daily.     No current facility-administered medications for this visit.    Family History  Problem Relation Age of Onset   Melanoma Mother 30       left leg   Arthritis Mother    Miscarriages / [place] Mother    Dementia Father    Heart disease Father    Macular degeneration Father    Parkinson's disease Father    Macular degeneration Paternal Aunt    Macular degeneration Paternal Uncle    Cancer  Paternal 78    Healthy Daughter    Eating disorder Daughter    Depression Son    Arthritis Son    Breast cancer Neg Hx     Review of Systems  Constitutional: Negative.   Genitourinary: Negative.     Exam:   BP 134/69   Pulse 72   Ht '5\' 7"'$  (1.702 m)   Wt 142 lb 3.2 oz (64.5 kg)   LMP 11/22/2003 (Approximate)   BMI 22.27 kg/m   Height: '5\' 7"'$  (170.2 cm)  General appearance: alert, cooperative and appears stated age Breasts: normal appearance, no masses or tenderness Abdomen: soft, non-tender; bowel sounds normal; no masses,  no organomegaly Lymph nodes: Cervical, supraclavicular, and axillary nodes normal.  No abnormal inguinal nodes palpated Neurologic: Grossly normal  Pelvic: External genitalia:  no lesions              Urethra:  normal appearing urethra with no masses, tenderness or lesions              Bartholins and Skenes: normal                 Vagina: normal appearing vagina with atrophic changes and no discharge, no lesions              Cervix: no lesions              Pap taken: Yes.   Bimanual Exam:  Uterus:  normal size, contour, position, consistency, mobility, non-tender              Adnexa: normal adnexa and no mass, fullness, tenderness               Rectovaginal: Confirms               Anus:  normal sphincter tone, no lesions  Chaperone, Octaviano Batty, CMA, was present for exam.  Assessment/Plan: 1. Encounter for routine gynecologic examination in Medicare patient - Pap smear with HR HPV obtained today - Mammogram 05/2021 - Colonoscopy 2020, follow up 5 years.  Reviewed note in Care Everywhere after colonoscopy complted - Bone mineral density ordered to do with mammogram this year - lab work done with PCP,  - vaccines reviewed/updated  2. Cervical cancer screening  3. LGSIL on Pap smear of cervix - Cytology - PAP( [place])  4. Osteopenia, unspecified location - DG BONE DENSITY (DXA); Future  5. Vaginal atrophy - Estradiol (YUVAFEM) 10  MCG TABS vaginal tablet; Place 1 tablet (10 mcg total) vaginally 2 (two) times a week.  Dispense: 24 tablet; Refill: 4

## 2022-01-31 LAB — CYTOLOGY - PAP
Comment: NEGATIVE
Diagnosis: NEGATIVE
Diagnosis: REACTIVE
High risk HPV: NEGATIVE

## 2022-01-31 IMAGING — MG MM DIGITAL SCREENING BILAT W/ TOMO AND CAD
6 of 12 series · 6 of 36 positions shown · non-contrast
Comparison: Previous exam(s).

CLINICAL DATA: Screening.

EXAM:
DIGITAL SCREENING BILATERAL MAMMOGRAM WITH TOMOSYNTHESIS AND CAD
TECHNIQUE: Bilateral screening digital craniocaudal and mediolateral oblique
mammograms were obtained. Bilateral screening digital breast
tomosynthesis was performed. The images were evaluated with
computer-aided detection.

[R MLO synth-2D (1 of 2)]
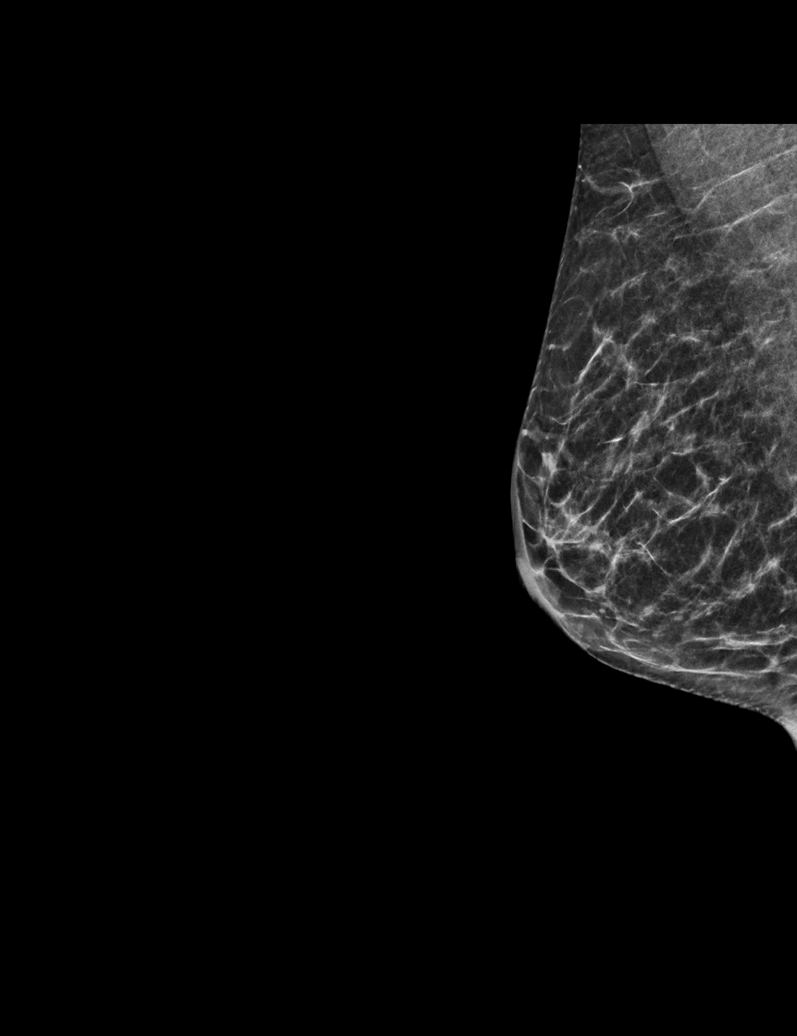

[R MLO synth-2D (2 of 2)]
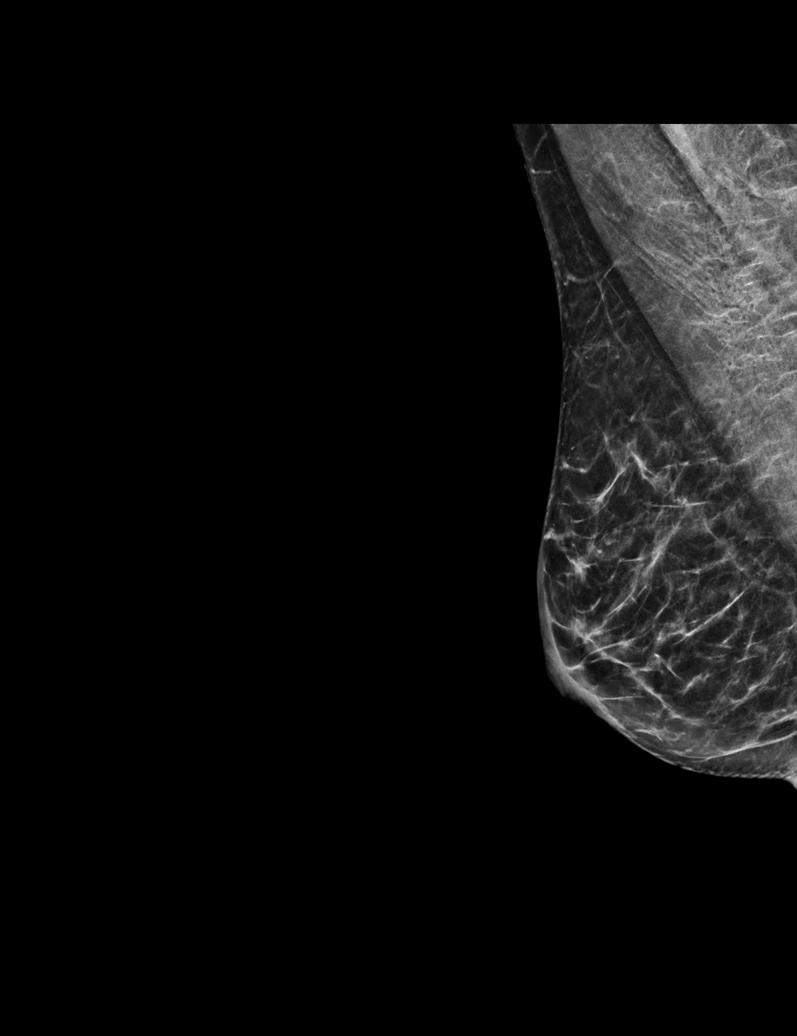

[L MLO synth-2D]
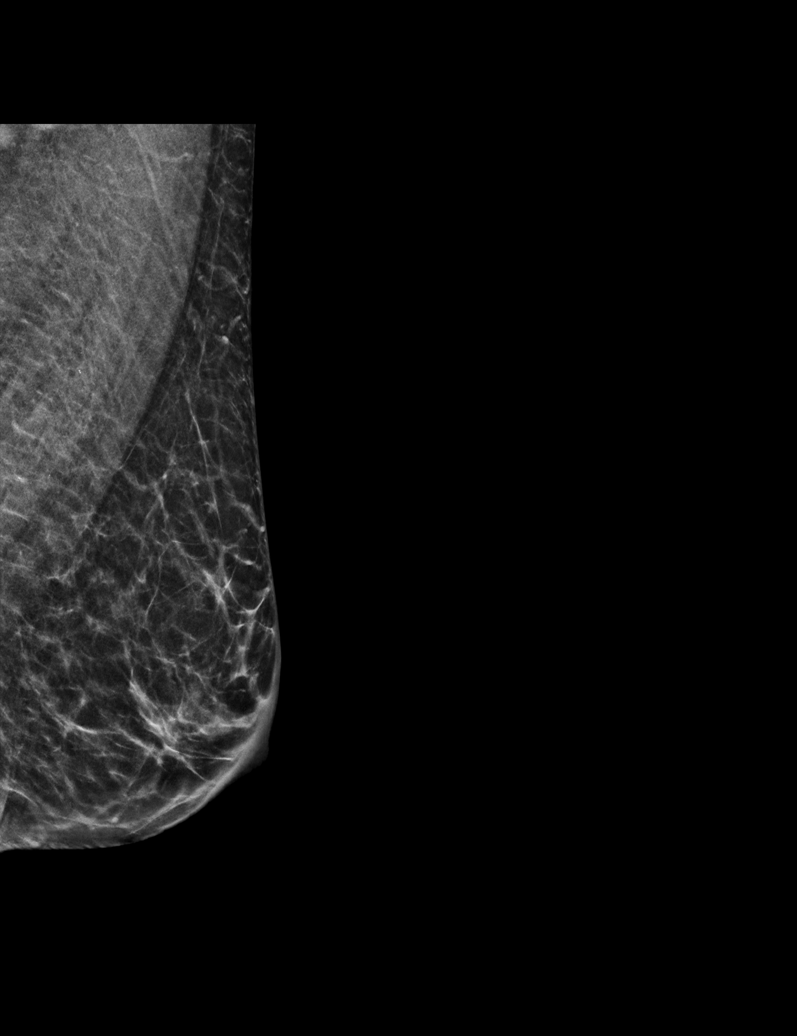

[L CC synth-2D (1 of 2)]
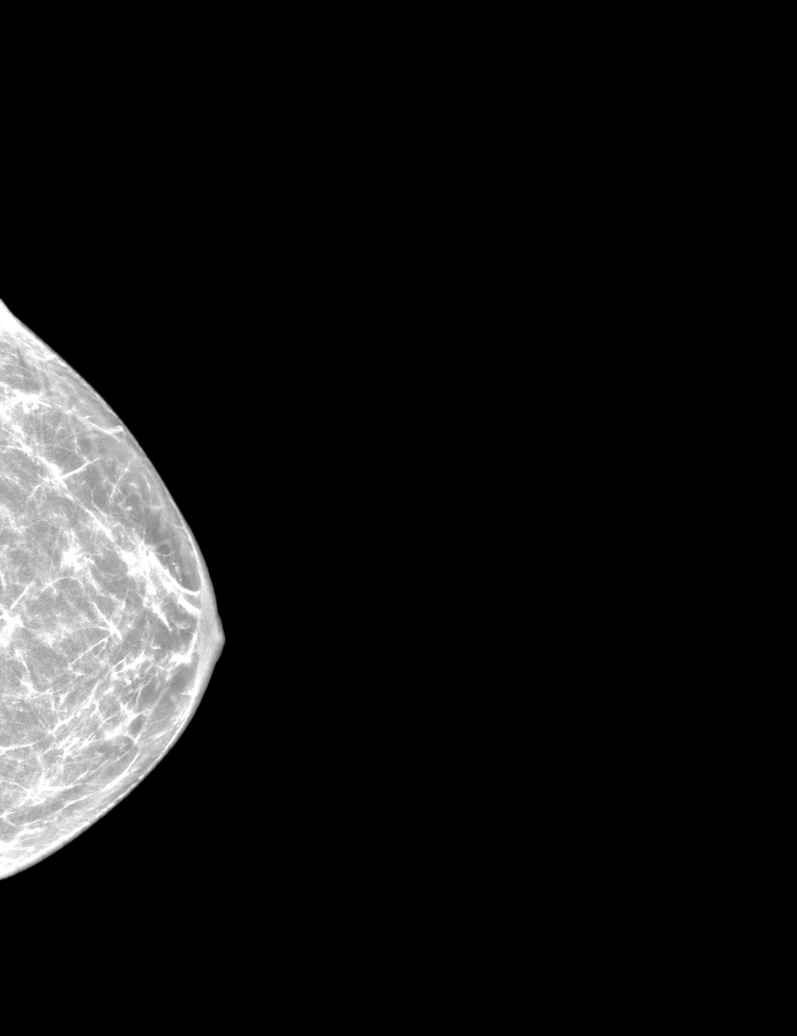

[L CC synth-2D (2 of 2)]
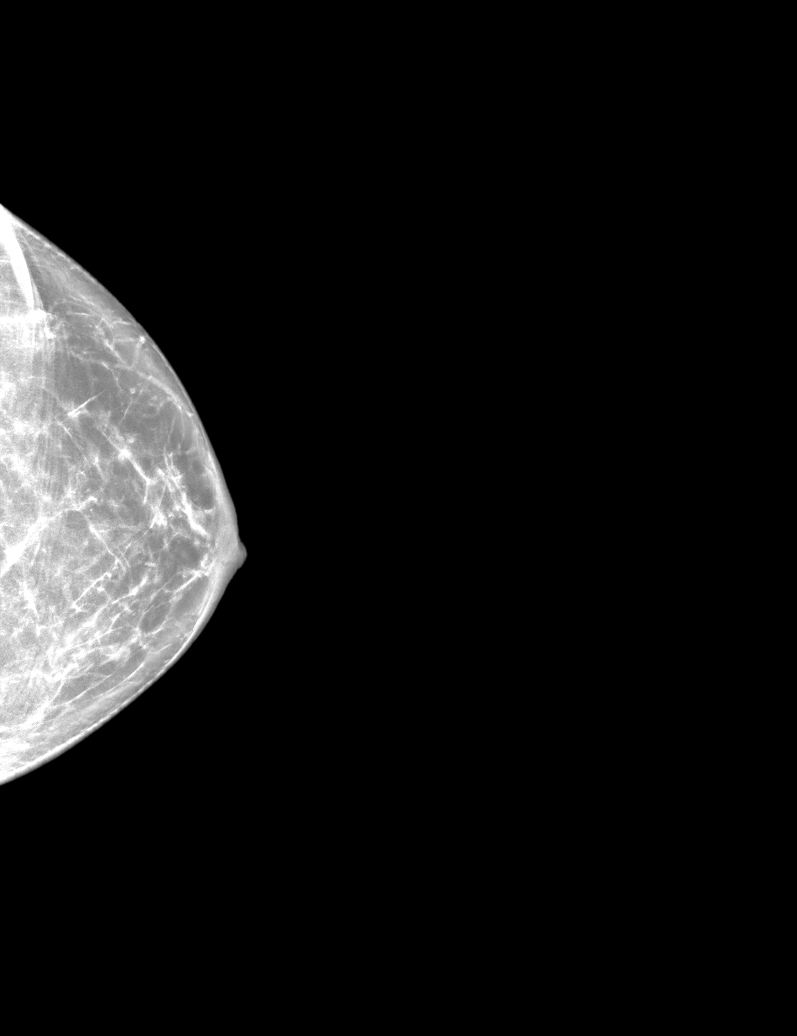

[R CC synth-2D]
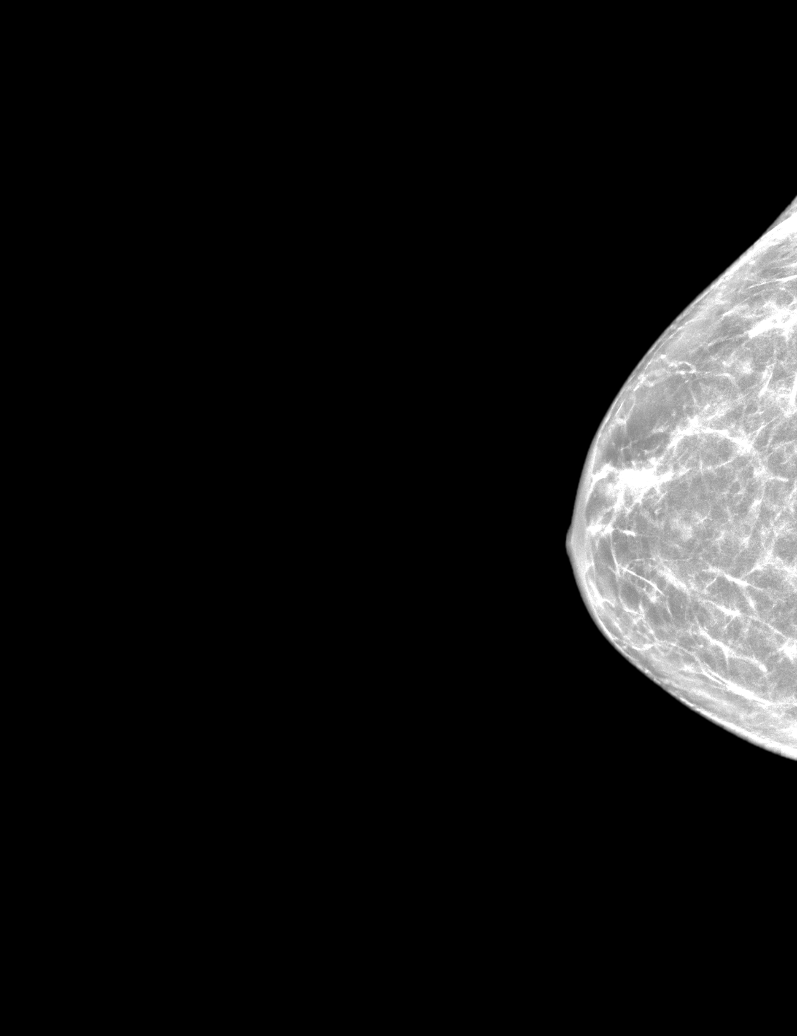

[6 of 36 positions shown; findings below may reference images not displayed]

ACR Breast Density Category b: There are scattered areas of
fibroglandular density.
FINDINGS: There are no findings suspicious for malignancy. The images were
evaluated with computer-aided detection.
IMPRESSION: No mammographic evidence of malignancy. A result letter of this
screening mammogram will be mailed directly to the patient.

RECOMMENDATION:
Screening mammogram in one year. (Code:[id number]-[id number])

BI-RADS CATEGORY  1: Negative.

## 2022-02-03 DIAGNOSIS — R69 Illness, unspecified: Secondary | ICD-10-CM | POA: Diagnosis not present

## 2022-02-04 ENCOUNTER — Other Ambulatory Visit: Payer: Self-pay | Admitting: Physician Assistant

## 2022-03-12 DIAGNOSIS — I10 Essential (primary) hypertension: Secondary | ICD-10-CM | POA: Diagnosis not present

## 2022-03-19 ENCOUNTER — Ambulatory Visit: Payer: Medicare HMO | Admitting: Physician Assistant

## 2022-03-21 DIAGNOSIS — H10501 Unspecified blepharoconjunctivitis, right eye: Secondary | ICD-10-CM | POA: Diagnosis not present

## 2022-03-21 DIAGNOSIS — H0014 Chalazion left upper eyelid: Secondary | ICD-10-CM | POA: Diagnosis not present

## 2022-04-10 ENCOUNTER — Ambulatory Visit: Payer: Medicare HMO | Admitting: Physician Assistant

## 2022-04-11 ENCOUNTER — Encounter: Payer: Self-pay | Admitting: Physician Assistant

## 2022-04-11 ENCOUNTER — Ambulatory Visit: Payer: Medicare HMO | Admitting: Physician Assistant

## 2022-04-11 DIAGNOSIS — F411 Generalized anxiety disorder: Secondary | ICD-10-CM | POA: Diagnosis not present

## 2022-04-11 DIAGNOSIS — G4700 Insomnia, unspecified: Secondary | ICD-10-CM | POA: Diagnosis not present

## 2022-04-11 DIAGNOSIS — F329 Major depressive disorder, single episode, unspecified: Secondary | ICD-10-CM | POA: Diagnosis not present

## 2022-04-11 MED ORDER — AMITRIPTYLINE HCL 10 MG PO TABS
10.0000 mg | ORAL_TABLET | Freq: Every day | ORAL | 1 refills | Status: DC
Start: 2022-04-11 — End: 2022-10-07

## 2022-04-11 MED ORDER — LORAZEPAM 1 MG PO TABS
1.0000 mg | ORAL_TABLET | Freq: Every day | ORAL | 5 refills | Status: DC
Start: 2022-04-11 — End: 2022-10-07

## 2022-04-11 MED ORDER — FLUVOXAMINE MALEATE 100 MG PO TABS
200.0000 mg | ORAL_TABLET | Freq: Every day | ORAL | 1 refills | Status: DC
Start: 2022-04-11 — End: 2022-06-14

## 2022-04-11 NOTE — Progress Notes (Signed)
[facility]  Patient ID: Madison Carr,  MRN: [id number]  PCP: Barrie Lyme  Date of Evaluation: 04/11/2022 Time spent:20 minutes  Chief Complaint:  Chief Complaint   Anxiety; Depression; Insomnia; Follow-up    HISTORY/CURRENT STATUS: HPI For routine med check  Still gets anxious at times but feels like meds help. No PA.  Feels "nervous in her stomach."  It goes away pretty quickly.  She usually only takes the Ativan at night.  Has never tried it during the day for this reason.  She stays active, plays tennis, helps out with her grandchildren, and travels some.  Patient is able to enjoy things.  Energy and motivation are good.  No extreme sadness, tearfulness, or feelings of hopelessness.  Sleeps well most of the time. ADLs and personal hygiene are normal.   Denies any changes in concentration, making decisions, or remembering things.  Appetite has not changed.  Weight is stable.   Denies suicidal or homicidal thoughts.  Patient denies increased energy with decreased need for sleep, increased talkativeness, racing thoughts, impulsivity or risky behaviors, increased spending, increased libido, grandiosity, increased irritability or anger, paranoia, or hallucinations.  Denies dizziness, syncope, seizures, numbness, tingling, tremor, tics, unsteady gait, slurred speech, confusion. Denies muscle or joint pain, stiffness, or dystonia.  Individual Medical History/ Review of Systems: Changes? :No       Past Psychiatric History:    No mental health hospitalizations. No suicide attempts. No self harm.    Past medications for mental health diagnoses include: Elavil, Ativan, Trazodone only for a little while, was not effective.  Allergies: Patient has no known allergies.  Current Medications:  Current Outpatient Medications:    Ascorbic Acid (VITAMIN C) 500 MG CAPS, Take 1 capsule by mouth daily., Disp: , Rfl:    Calcium Carbonate-Vitamin D3 600-400 MG-UNIT TABS,  1 tablet with food, Disp: , Rfl:    CALCIUM-MAGNESIUM-ZINC PO, Take by mouth. Calcium 1000mg  Magnesium 400mg   Zinc 25mg , Disp: , Rfl:    cholecalciferol (VITAMIN D) 1000 UNITS tablet, Take 1,000 Units by mouth daily., Disp: , Rfl:    Estradiol (YUVAFEM) 10 MCG TABS vaginal tablet, Place 1 tablet (10 mcg total) vaginally 2 (two) times a week., Disp: 24 tablet, Rfl: 4   hydrochlorothiazide (HYDRODIURIL) 12.5 MG tablet, Take 1 tablet (12.5 mg total) by mouth daily., Disp: 90 tablet, Rfl: 1   Multiple Vitamins-Minerals (OCUVITE PO), See admin instructions., Disp: , Rfl:    Multiple Vitamins-Minerals (ZINC PO), Take 1 tablet by mouth daily., Disp: , Rfl:    amitriptyline (ELAVIL) 10 MG tablet, Take 1 tablet (10 mg total) by mouth at bedtime., Disp: 90 tablet, Rfl: 1   fluvoxaMINE (LUVOX) 100 MG tablet, Take 2 tablets (200 mg total) by mouth at bedtime., Disp: 180 tablet, Rfl: 1   LORazepam (ATIVAN) 1 MG tablet, Take 1 tablet (1 mg total) by mouth at bedtime., Disp: 30 tablet, Rfl: 5 Medication Side Effects: none  Family Medical/ Social History: Changes? No  MENTAL HEALTH EXAM:  Last menstrual period 11/22/2003.There is no height or weight on file to calculate BMI.  General Appearance: Casual and Well Groomed  Eye Contact:  Good  Speech:  Clear and Coherent and Normal Rate  Volume:  Normal  Mood:  Euthymic  Affect:  Congruent  Thought Process:  Goal Directed and Descriptions of Associations: Circumstantial  Orientation:  Full (Time, Place, and Person)  Thought Content: Logical   Suicidal Thoughts:  No  Homicidal Thoughts:  No  Memory:  Immediate;   Fair Recent;   Fair Remote;   Good  Judgement:  Good  Insight:  Good  Psychomotor Activity:  Normal  Concentration:  Concentration: Good and Attention Span: Good  Recall:  Good  Fund of Knowledge: Good  Language: Good  Assets:  Desire for Improvement Financial Resources/Insurance Housing Transportation  ADL's:  Intact  Cognition: WNL   Prognosis:  Good   DIAGNOSES:    ICD-10-CM   1. Generalized anxiety disorder  F41.1     2. Insomnia, unspecified type  G47.00     3. 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  Ativan filled 03/31/2022. I provided 20 minutes of face to face time during this encounter, including time spent before and after the visit in records review, medical decision making, counseling pertinent to today's visit, and charting.   Overall she is doing well.  She could try taking 1/2 pill of Ativan during the day as needed anxiety.  She knows to not take it and drive however.  Continue healthy lifestyle including exercise and healthy diet.  Continue amitriptyline 10 mg, 1 p.o. nightly. Continue Luvox 100 mg 2 p.o. nightly. Continue Ativan 1 mg, 1 p.o. nightly as needed.  (May take 1/2 pill during the day as needed.) Continue multivitamin, vitamin D, fish oil, B12, CoQ 10. Continue therapy with Evelena Peat Deussing. Return in 6 months.  Donnal Moat, PA-C

## 2022-04-12 DIAGNOSIS — H10412 Chronic giant papillary conjunctivitis, left eye: Secondary | ICD-10-CM | POA: Diagnosis not present

## 2022-04-12 DIAGNOSIS — H04122 Dry eye syndrome of left lacrimal gland: Secondary | ICD-10-CM | POA: Diagnosis not present

## 2022-04-16 ENCOUNTER — Ambulatory Visit (HOSPITAL_BASED_OUTPATIENT_CLINIC_OR_DEPARTMENT_OTHER)
Admission: RE | Admit: 2022-04-16 | Discharge: 2022-04-16 | Disposition: A | Discharge status: Home / Self Care | Payer: Medicare HMO | Source: Ambulatory Visit | Attending: Obstetrics & Gynecology | Admitting: Obstetrics & Gynecology

## 2022-04-16 DIAGNOSIS — M85852 Other specified disorders of bone density and structure, left thigh: Secondary | ICD-10-CM | POA: Diagnosis not present

## 2022-04-16 DIAGNOSIS — M8589 Other specified disorders of bone density and structure, multiple sites: Secondary | ICD-10-CM | POA: Diagnosis not present

## 2022-04-16 DIAGNOSIS — M8580 Other specified disorders of bone density and structure, unspecified site: Secondary | ICD-10-CM | POA: Diagnosis not present

## 2022-04-16 DIAGNOSIS — Z780 Asymptomatic menopausal state: Secondary | ICD-10-CM | POA: Diagnosis not present

## 2022-05-06 ENCOUNTER — Other Ambulatory Visit: Payer: Self-pay | Admitting: Obstetrics & Gynecology

## 2022-05-06 DIAGNOSIS — Z1231 Encounter for screening mammogram for malignant neoplasm of breast: Secondary | ICD-10-CM

## 2022-05-13 DIAGNOSIS — L570 Actinic keratosis: Secondary | ICD-10-CM | POA: Diagnosis not present

## 2022-05-13 DIAGNOSIS — L720 Epidermal cyst: Secondary | ICD-10-CM | POA: Diagnosis not present

## 2022-05-13 DIAGNOSIS — L814 Other melanin hyperpigmentation: Secondary | ICD-10-CM | POA: Diagnosis not present

## 2022-05-13 DIAGNOSIS — L820 Inflamed seborrheic keratosis: Secondary | ICD-10-CM | POA: Diagnosis not present

## 2022-05-13 DIAGNOSIS — D2362 Other benign neoplasm of skin of left upper limb, including shoulder: Secondary | ICD-10-CM | POA: Diagnosis not present

## 2022-05-13 DIAGNOSIS — L821 Other seborrheic keratosis: Secondary | ICD-10-CM | POA: Diagnosis not present

## 2022-05-13 DIAGNOSIS — Z85828 Personal history of other malignant neoplasm of skin: Secondary | ICD-10-CM | POA: Diagnosis not present

## 2022-05-27 DIAGNOSIS — H04211 Epiphora due to excess lacrimation, right lacrimal gland: Secondary | ICD-10-CM | POA: Diagnosis not present

## 2022-05-27 DIAGNOSIS — H0014 Chalazion left upper eyelid: Secondary | ICD-10-CM | POA: Diagnosis not present

## 2022-06-13 ENCOUNTER — Ambulatory Visit
Admission: RE | Admit: 2022-06-13 | Discharge: 2022-06-13 | Disposition: A | Discharge status: Home / Self Care | Payer: Medicare HMO | Source: Ambulatory Visit | Attending: Obstetrics & Gynecology | Admitting: Obstetrics & Gynecology

## 2022-06-13 DIAGNOSIS — Z1231 Encounter for screening mammogram for malignant neoplasm of breast: Secondary | ICD-10-CM

## 2022-06-14 ENCOUNTER — Other Ambulatory Visit: Payer: Self-pay | Admitting: Physician Assistant

## 2022-06-14 NOTE — Telephone Encounter (Signed)
She was told by the pharmacy she couldn't get the refill until June 2, but she's needs it before then because she will be out of town.

## 2022-06-14 NOTE — Telephone Encounter (Signed)
Pt LVM @ 12:11p for refill of Lorazepam to [facility] [address]  She is leaving town next week.   Next appt 9/16

## 2022-06-19 ENCOUNTER — Telehealth: Payer: Self-pay | Admitting: Physician Assistant

## 2022-06-19 NOTE — Telephone Encounter (Signed)
Patient called again at 1:09 concerning the Lorazepam Rx RF. She stated she is leaving town on Friday.  [facility] [address] [facility], but have not heard from our office.

## 2022-06-19 NOTE — Telephone Encounter (Signed)
Pt called and said that she needs an early refill on her lorazapam. She is leaving to go to the beach on Friday and the refill is not due until 6/6. Please call the pharmacy and ok fill or [facility] a [facility] [address]

## 2022-06-19 NOTE — Telephone Encounter (Signed)
Patient LF 5/4. Early RF was authorized.

## 2022-06-19 NOTE — Telephone Encounter (Signed)
Please call her pharmacy and ok early RF, this once. Then next RF to be 07/26/2022 or later. Thanks.

## 2022-07-10 DIAGNOSIS — D485 Neoplasm of uncertain behavior of skin: Secondary | ICD-10-CM | POA: Diagnosis not present

## 2022-07-10 DIAGNOSIS — L821 Other seborrheic keratosis: Secondary | ICD-10-CM | POA: Diagnosis not present

## 2022-07-22 DIAGNOSIS — H25041 Posterior subcapsular polar age-related cataract, right eye: Secondary | ICD-10-CM | POA: Diagnosis not present

## 2022-07-22 DIAGNOSIS — H52203 Unspecified astigmatism, bilateral: Secondary | ICD-10-CM | POA: Diagnosis not present

## 2022-07-22 DIAGNOSIS — H04123 Dry eye syndrome of bilateral lacrimal glands: Secondary | ICD-10-CM | POA: Diagnosis not present

## 2022-07-22 DIAGNOSIS — H2513 Age-related nuclear cataract, bilateral: Secondary | ICD-10-CM | POA: Diagnosis not present

## 2022-10-04 DIAGNOSIS — S0181XA Laceration without foreign body of other part of head, initial encounter: Secondary | ICD-10-CM | POA: Diagnosis not present

## 2022-10-04 DIAGNOSIS — H6121 Impacted cerumen, right ear: Secondary | ICD-10-CM | POA: Diagnosis not present

## 2022-10-07 ENCOUNTER — Encounter: Payer: Self-pay | Admitting: Physician Assistant

## 2022-10-07 ENCOUNTER — Ambulatory Visit: Payer: Medicare HMO | Admitting: Physician Assistant

## 2022-10-07 DIAGNOSIS — G4700 Insomnia, unspecified: Secondary | ICD-10-CM | POA: Diagnosis not present

## 2022-10-07 DIAGNOSIS — F329 Major depressive disorder, single episode, unspecified: Secondary | ICD-10-CM | POA: Diagnosis not present

## 2022-10-07 DIAGNOSIS — F411 Generalized anxiety disorder: Secondary | ICD-10-CM

## 2022-10-07 MED ORDER — AMITRIPTYLINE HCL 10 MG PO TABS: 10.0000 mg | ORAL_TABLET | Freq: Every day | ORAL | 1 refills | Status: DC

## 2022-10-07 MED ORDER — FLUVOXAMINE MALEATE 100 MG PO TABS: 200.0000 mg | ORAL_TABLET | Freq: Every day | ORAL | 1 refills | Status: DC

## 2022-10-07 MED ORDER — LORAZEPAM 1 MG PO TABS
1.0000 mg | ORAL_TABLET | Freq: Every day | ORAL | 5 refills | Status: DC
Start: 2022-10-07 — End: 2023-03-31

## 2022-10-07 NOTE — Progress Notes (Signed)
[facility]  Patient ID: Madison Carr,  MRN: [id number]  PCP: Exie Parody  Date of Evaluation: 10/07/2022 Time spent:20 minutes  Chief Complaint:  Chief Complaint   Anxiety; Depression; Follow-up    HISTORY/CURRENT STATUS: HPI For routine med check  Madison Carr is doing well as far as her mental health goes.  She still gets anxious at times but is much better than when she was in the past.  Does not have panic attacks.  But does have a sense of dread sometimes, like something bad could happen at any time.  She takes the Ativan only in the evening so she can relax and go to sleep.  Patient is able to enjoy things.  Energy and motivation are good.  She is retired, active in church, enjoys tennis and spending time with her grandkids.  No extreme sadness, tearfulness, or feelings of hopelessness.  Sleeps well most of the time. ADLs and personal hygiene are normal.   Denies any changes in concentration, making decisions, or remembering things.  Appetite has not changed.  Weight is stable.   Denies suicidal or homicidal thoughts.  Patient denies increased energy with decreased need for sleep, increased talkativeness, racing thoughts, impulsivity or risky behaviors, increased spending, increased libido, grandiosity, increased irritability or anger, paranoia, or hallucinations.  Denies dizziness, syncope, seizures, numbness, tingling, tremor, tics, unsteady gait, slurred speech, confusion. Denies muscle or joint pain, stiffness, or dystonia.  Individual Medical History/ Review of Systems: Changes? :Yes     she fell on 10/04/2022, she did not realize there was a curb and she stepped off and fell.  She had to go to the ER and had a laceration on her forehead sutured.  No loss of consciousness.  States she was not dizzy or having problems with her balance that could have caused the fall.  Past Psychiatric History:    No mental health hospitalizations. No suicide attempts. No self  harm.    Past medications for mental health diagnoses include: Elavil, Ativan, Trazodone only for a little while, was not effective.  Allergies: Patient has no known allergies.  Current Medications:  Current Outpatient Medications:    Ascorbic Acid (VITAMIN C) 500 MG CAPS, Take 1 capsule by mouth daily., Disp: , Rfl:    Calcium Carbonate-Vitamin D3 600-400 MG-UNIT TABS, 1 tablet with food, Disp: , Rfl:    CALCIUM-MAGNESIUM-ZINC PO, Take by mouth. Calcium 1000mg  Magnesium 400mg   Zinc 25mg , Disp: , Rfl:    cholecalciferol (VITAMIN D) 1000 UNITS tablet, Take 1,000 Units by mouth daily., Disp: , Rfl:    Estradiol (YUVAFEM) 10 MCG TABS vaginal tablet, Place 1 tablet (10 mcg total) vaginally 2 (two) times a week., Disp: 24 tablet, Rfl: 4   hydrochlorothiazide (HYDRODIURIL) 12.5 MG tablet, Take 1 tablet (12.5 mg total) by mouth daily., Disp: 90 tablet, Rfl: 1   Multiple Vitamins-Minerals (OCUVITE PO), See admin instructions., Disp: , Rfl:    Multiple Vitamins-Minerals (ZINC PO), Take 1 tablet by mouth daily., Disp: , Rfl:    amitriptyline (ELAVIL) 10 MG tablet, Take 1 tablet (10 mg total) by mouth at bedtime., Disp: 90 tablet, Rfl: 1   fluvoxaMINE (LUVOX) 100 MG tablet, Take 2 tablets (200 mg total) by mouth at bedtime., Disp: 180 tablet, Rfl: 1   LORazepam (ATIVAN) 1 MG tablet, Take 1 tablet (1 mg total) by mouth at bedtime., Disp: 30 tablet, Rfl: 5 Medication Side Effects: none  Family Medical/ Social History: Changes? No  MENTAL HEALTH EXAM:  Last menstrual period 11/22/2003.There is no height or weight on file to calculate BMI.  General Appearance: Casual, Well Groomed, and healing laceration on right forehead, dime-sized purplish bruise on right cheek  Eye Contact:  Good  Speech:  Clear and Coherent and Normal Rate  Volume:  Normal  Mood:  Euthymic  Affect:  Congruent  Thought Process:  Goal Directed and Descriptions of Associations: Circumstantial  Orientation:  Full (Time, Place,  and Person)  Thought Content: Logical   Suicidal Thoughts:  No  Homicidal Thoughts:  No  Memory:  Immediate;   Fair Recent;   Fair Remote;   Good  Judgement:  Good  Insight:  Good  Psychomotor Activity:  Normal  Concentration:  Concentration: Good and Attention Span: Good  Recall:  Good  Fund of Knowledge: Good  Language: Good  Assets:  Desire for Improvement Financial Resources/Insurance Housing Transportation  ADL's:  Intact  Cognition: WNL  Prognosis:  Good   DIAGNOSES:    ICD-10-CM   1. Generalized anxiety disorder  F41.1     2. 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 Insomnia, unspecified type  G47.00      Receiving Psychotherapy: Yes  with Brayton Caves Deussing  RECOMMENDATIONS:  PDMP reviewed.  Ativan filled 09/16/2022. I provided 20 minutes of face to face time during this encounter, including time spent before and after the visit in records review, medical decision making, counseling pertinent to today's visit, and charting.   She is doing well as far as her medications go so no change will be made.  We again discussed the fact that confusion and in balance can occur with any benzodiazepine and the risk increases with age.  Neither of [place] feel that the fall was due to the benzodiazepine, she only takes it at night to help her relax to go to sleep.   Continue amitriptyline 10 mg, 1 p.o. nightly. Continue Luvox 100 mg 2 p.o. nightly. Continue Ativan 1 mg, 1 p.o. nightly as needed.  (May take 1/2 pill during the day as needed.) Continue multivitamin, vitamin D, fish oil, B12, CoQ 10. Continue therapy with Brayton Caves Deussing. Return in 6 months.  Melony Overly, PA-C

## 2022-10-14 ENCOUNTER — Ambulatory Visit: Payer: Medicare HMO | Admitting: Physician Assistant

## 2023-01-09 DIAGNOSIS — H25041 Posterior subcapsular polar age-related cataract, right eye: Secondary | ICD-10-CM | POA: Diagnosis not present

## 2023-01-09 DIAGNOSIS — H2513 Age-related nuclear cataract, bilateral: Secondary | ICD-10-CM | POA: Diagnosis not present

## 2023-01-09 DIAGNOSIS — H43813 Vitreous degeneration, bilateral: Secondary | ICD-10-CM | POA: Diagnosis not present

## 2023-01-09 DIAGNOSIS — H31003 Unspecified chorioretinal scars, bilateral: Secondary | ICD-10-CM | POA: Diagnosis not present

## 2023-01-25 ENCOUNTER — Other Ambulatory Visit: Payer: Self-pay | Admitting: Physician Assistant

## 2023-03-31 ENCOUNTER — Encounter: Payer: Self-pay | Admitting: Physician Assistant

## 2023-03-31 ENCOUNTER — Ambulatory Visit (INDEPENDENT_AMBULATORY_CARE_PROVIDER_SITE_OTHER): Payer: Medicare HMO | Admitting: Physician Assistant

## 2023-03-31 DIAGNOSIS — F329 Major depressive disorder, single episode, unspecified: Secondary | ICD-10-CM

## 2023-03-31 DIAGNOSIS — R5381 Other malaise: Secondary | ICD-10-CM | POA: Diagnosis not present

## 2023-03-31 DIAGNOSIS — Z79899 Other long term (current) drug therapy: Secondary | ICD-10-CM

## 2023-03-31 DIAGNOSIS — F411 Generalized anxiety disorder: Secondary | ICD-10-CM | POA: Diagnosis not present

## 2023-03-31 DIAGNOSIS — G4700 Insomnia, unspecified: Secondary | ICD-10-CM | POA: Diagnosis not present

## 2023-03-31 DIAGNOSIS — R5383 Other fatigue: Secondary | ICD-10-CM

## 2023-03-31 MED ORDER — FLUVOXAMINE MALEATE 100 MG PO TABS: 200.0000 mg | ORAL_TABLET | Freq: Every day | ORAL | 1 refills | Status: DC

## 2023-03-31 MED ORDER — LORAZEPAM 1 MG PO TABS: 1.0000 mg | ORAL_TABLET | Freq: Two times a day (BID) | ORAL | 5 refills | Status: DC | PRN

## 2023-03-31 MED ORDER — AMITRIPTYLINE HCL 10 MG PO TABS: 10.0000 mg | ORAL_TABLET | Freq: Every day | ORAL | 1 refills | Status: DC

## 2023-03-31 NOTE — Progress Notes (Signed)
 [facility]  Patient ID: Madison Carr,  MRN: [id number]  PCP: Exie Parody  Date of Evaluation: 03/31/2023 Time spent:30 minutes  Chief Complaint:  Chief Complaint   Anxiety; Depression; Insomnia; Follow-up    HISTORY/CURRENT STATUS: HPI For routine med check  Still has anxiety, feels nervous a lot, sometimes triggered but it can just happen out of the blue. Her hands will shake a little. "I guess it's what the older people called bad nerves."  Not having PA, just gets overwhelmed and it's hard to get the anxiety under control then.  She has never taken a benzodiazepine during the day, only at night for sleep.  Patient is able to enjoy things.  Energy and motivation are good.  No extreme sadness, tearfulness, or feelings of hopelessness.  She has always had trouble sleeping and if it was not for her medication cocktail that she takes now she would not sleep much at all.  ADLs and personal hygiene are normal.   Denies any changes in concentration, making decisions, or remembering things.  Appetite has not changed.  Weight is stable.   Denies suicidal or homicidal thoughts.  Patient denies increased energy with decreased need for sleep, increased talkativeness, racing thoughts, impulsivity or risky behaviors, increased spending, increased libido, grandiosity, increased irritability or anger, paranoia, or hallucinations.  Review of Systems  Constitutional:  Positive for malaise/fatigue.       Is tired a lot. Not new  HENT: Negative.    Eyes: Negative.   Respiratory: Negative.    Cardiovascular: Negative.   Gastrointestinal: Negative.   Genitourinary: Negative.   Musculoskeletal: Negative.   Skin: Negative.   Neurological: Negative.   Endo/Heme/Allergies: Negative.   Psychiatric/Behavioral:         See HPI   Individual Medical History/ Review of Systems: Changes? :No     Past Psychiatric History:    No mental health hospitalizations. No suicide  attempts. No self harm.    Past medications for mental health diagnoses include: Elavil, Ativan, Trazodone only for a little while, was not effective.  Allergies: Patient has no known allergies.  Current Medications:  Current Outpatient Medications:    Ascorbic Acid (VITAMIN C) 500 MG CAPS, Take 1 capsule by mouth daily., Disp: , Rfl:    Calcium Carbonate-Vitamin D3 600-400 MG-UNIT TABS, 1 tablet with food, Disp: , Rfl:    CALCIUM-MAGNESIUM-ZINC PO, Take by mouth. Calcium 1000mg  Magnesium 400mg   Zinc 25mg , Disp: , Rfl:    cholecalciferol (VITAMIN D) 1000 UNITS tablet, Take 1,000 Units by mouth daily., Disp: , Rfl:    Estradiol (YUVAFEM) 10 MCG TABS vaginal tablet, Place 1 tablet (10 mcg total) vaginally 2 (two) times a week., Disp: 24 tablet, Rfl: 4   hydrochlorothiazide (HYDRODIURIL) 12.5 MG tablet, Take 1 tablet (12.5 mg total) by mouth daily., Disp: 90 tablet, Rfl: 1   Multiple Vitamins-Minerals (OCUVITE PO), See admin instructions., Disp: , Rfl:    Multiple Vitamins-Minerals (ZINC PO), Take 1 tablet by mouth daily., Disp: , Rfl:    amitriptyline (ELAVIL) 10 MG tablet, Take 1 tablet (10 mg total) by mouth at bedtime., Disp: 90 tablet, Rfl: 1   fluvoxaMINE (LUVOX) 100 MG tablet, Take 2 tablets (200 mg total) by mouth at bedtime., Disp: 180 tablet, Rfl: 1   LORazepam (ATIVAN) 1 MG tablet, Take 1 tablet (1 mg total) by mouth 2 (two) times daily as needed for anxiety., Disp: 60 tablet, Rfl: 5 Medication Side Effects: none  Family Medical/ Social History:  Changes? No  MENTAL HEALTH EXAM:  Last menstrual period 11/22/2003.There is no height or weight on file to calculate BMI.  General Appearance: Casual and Well Groomed  Eye Contact:  Good  Speech:  Clear and Coherent and Normal Rate  Volume:  Normal  Mood:  Euthymic  Affect:  Congruent  Thought Process:  Goal Directed and Descriptions of Associations: Circumstantial  Orientation:  Full (Time, Place, and Person)  Thought Content:  Logical   Suicidal Thoughts:  No  Homicidal Thoughts:  No  Memory:  Immediate;   Fair Recent;   Fair Remote;   Good  Judgement:  Good  Insight:  Good  Psychomotor Activity:  Normal  Concentration:  Concentration: Good and Attention Span: Good  Recall:  Good  Fund of Knowledge: Good  Language: Good  Assets:  Desire for Improvement Financial Resources/Insurance Housing Transportation  ADL's:  Intact  Cognition: WNL  Prognosis:  Good   DIAGNOSES:    ICD-10-CM   1. Generalized anxiety disorder  F41.1 CBC with Differential/Platelet    Comprehensive metabolic panel    Hemoglobin A1c    TSH    VITAMIN D 25 Hydroxy (Vit-D Deficiency, Fractures)    B12 and Folate Panel    2. Reactive depression  F32.9 Comprehensive metabolic panel    Hemoglobin A1c    TSH    3. Insomnia, unspecified type  G47.00     4. Malaise and fatigue  R53.81 CBC with Differential/Platelet   R53.83 Comprehensive metabolic panel    Hemoglobin A1c    TSH    VITAMIN D 25 Hydroxy (Vit-D Deficiency, Fractures)    B12 and Folate Panel    5. Encounter for long-term (current) use of medications  Z79.899 CBC with Differential/Platelet    Comprehensive metabolic panel    Hemoglobin A1c    TSH    VITAMIN D 25 Hydroxy (Vit-D Deficiency, Fractures)    B12 and Folate Panel      Receiving Psychotherapy: Yes  with Brayton Caves Deussing  RECOMMENDATIONS:  PDMP reviewed.  Ativan filled 03/12/2023. I provided 30 minutes of face to face time during this encounter, including time spent before and after the visit in records review, medical decision making, counseling pertinent to today's visit, and charting.   We discussed the anxiety.  I recommend taking Ativan during the day as needed.  She has been on this medication for decades, only for sleep but she needs it for anxiety as well now.  We discussed the possible increased confusion and fall risks associated with benzodiazepines in the elderly.  She understands and accepts  the risks, feels like it is worth it to not be anxious all the time.  I recommend checking labs to evaluate the fatigue, anxiety, and depression.  Continue amitriptyline 10 mg, 1 p.o. nightly. Continue Luvox 100 mg 2 p.o. nightly. Increase Ativan 1 mg, to 1 p.o. twice daily as needed.  She will take the evening dose routinely as usual. Continue multivitamin, vitamin D, fish oil, B12, CoQ 10. Labs ordered as noted above. Continue therapy with Brayton Caves Deussing. Return in 2 months.  Melony Overly, PA-C

## 2023-04-02 LAB — CBC WITH DIFFERENTIAL/PLATELET
Basophils Absolute: 0.1 10*3/uL (ref 0.0–0.2)
Basos: 1 %
EOS (ABSOLUTE): 0.3 10*3/uL (ref 0.0–0.4)
Eos: 5 %
Hematocrit: 42.5 % (ref 34.0–46.6)
Hemoglobin: 14.3 g/dL (ref 11.1–15.9)
Immature Grans (Abs): 0 10*3/uL (ref 0.0–0.1)
Immature Granulocytes: 0 %
Lymphocytes Absolute: 1.2 10*3/uL (ref 0.7–3.1)
Lymphs: 20 %
MCH: 33.3 pg — ABNORMAL HIGH (ref 26.6–33.0)
MCHC: 33.6 g/dL (ref 31.5–35.7)
MCV: 99 fL — ABNORMAL HIGH (ref 79–97)
Monocytes Absolute: 0.6 10*3/uL (ref 0.1–0.9)
Monocytes: 10 %
Neutrophils Absolute: 4 10*3/uL (ref 1.4–7.0)
Neutrophils: 64 %
Platelets: 274 10*3/uL (ref 150–450)
RBC: 4.29 x10E6/uL (ref 3.77–5.28)
RDW: 11.5 % — ABNORMAL LOW (ref 11.7–15.4)
WBC: 6.2 10*3/uL (ref 3.4–10.8)

## 2023-04-02 LAB — COMPREHENSIVE METABOLIC PANEL
ALT: 12 IU/L (ref 0–32)
AST: 18 IU/L (ref 0–40)
Albumin: 4.3 g/dL (ref 3.9–4.9)
Alkaline Phosphatase: 83 IU/L (ref 44–121)
BUN/Creatinine Ratio: 20 (ref 12–28)
BUN: 15 mg/dL (ref 8–27)
Bilirubin Total: 0.4 mg/dL (ref 0.0–1.2)
CO2: 24 mmol/L (ref 20–29)
Calcium: 9.6 mg/dL (ref 8.7–10.3)
Chloride: 98 mmol/L (ref 96–106)
Creatinine, Ser: 0.75 mg/dL (ref 0.57–1.00)
Globulin, Total: 2.1 g/dL (ref 1.5–4.5)
Glucose: 89 mg/dL (ref 70–99)
Potassium: 4.2 mmol/L (ref 3.5–5.2)
Sodium: 142 mmol/L (ref 134–144)
Total Protein: 6.4 g/dL (ref 6.0–8.5)
eGFR: 86 mL/min/{1.73_m2} (ref >59)

## 2023-04-02 LAB — HEMOGLOBIN A1C
Est. average glucose Bld gHb Est-mCnc: 103 mg/dL
Hgb A1c MFr Bld: 5.2 % (ref 4.8–5.6)

## 2023-04-02 LAB — TSH: TSH: 1.34 u[IU]/mL (ref 0.450–4.500)

## 2023-04-02 LAB — VITAMIN D 25 HYDROXY (VIT D DEFICIENCY, FRACTURES): Vit D, 25-Hydroxy: 41.3 ng/mL (ref 30.0–100.0)

## 2023-04-02 LAB — B12 AND FOLATE PANEL
Folate: 11.6 ng/mL (ref >3.0)
Vitamin B-12: 1256 pg/mL — ABNORMAL HIGH (ref 232–1245)

## 2023-04-03 ENCOUNTER — Other Ambulatory Visit (HOSPITAL_BASED_OUTPATIENT_CLINIC_OR_DEPARTMENT_OTHER): Payer: Self-pay | Admitting: Obstetrics & Gynecology

## 2023-04-03 DIAGNOSIS — N952 Postmenopausal atrophic vaginitis: Secondary | ICD-10-CM

## 2023-04-03 NOTE — Progress Notes (Signed)
 Labs are essentially nl, except B12 is high normal. If she's on a supplement, stop it.

## 2023-04-08 NOTE — Progress Notes (Signed)
 Pt notified to stop her B12 supplement. I also contacted her pharmacy due to the Lorazepam 1 mg dose increase to 2/daily. They do have the Rx but they said can't be filled until tomorrow. Its a dose increase so not sure why. Pt is not out until tomorrow.

## 2023-04-10 DIAGNOSIS — F411 Generalized anxiety disorder: Secondary | ICD-10-CM | POA: Diagnosis not present

## 2023-04-10 DIAGNOSIS — I10 Essential (primary) hypertension: Secondary | ICD-10-CM | POA: Diagnosis not present

## 2023-04-17 DIAGNOSIS — I10 Essential (primary) hypertension: Secondary | ICD-10-CM | POA: Diagnosis not present

## 2023-04-17 DIAGNOSIS — H25041 Posterior subcapsular polar age-related cataract, right eye: Secondary | ICD-10-CM | POA: Diagnosis not present

## 2023-04-17 DIAGNOSIS — H04122 Dry eye syndrome of left lacrimal gland: Secondary | ICD-10-CM | POA: Diagnosis not present

## 2023-04-17 DIAGNOSIS — H52203 Unspecified astigmatism, bilateral: Secondary | ICD-10-CM | POA: Diagnosis not present

## 2023-04-17 DIAGNOSIS — H15102 Unspecified episcleritis, left eye: Secondary | ICD-10-CM | POA: Diagnosis not present

## 2023-04-17 DIAGNOSIS — F411 Generalized anxiety disorder: Secondary | ICD-10-CM | POA: Diagnosis not present

## 2023-04-17 DIAGNOSIS — H2513 Age-related nuclear cataract, bilateral: Secondary | ICD-10-CM | POA: Diagnosis not present

## 2023-05-06 DIAGNOSIS — H2513 Age-related nuclear cataract, bilateral: Secondary | ICD-10-CM | POA: Diagnosis not present

## 2023-05-19 DIAGNOSIS — L821 Other seborrheic keratosis: Secondary | ICD-10-CM | POA: Diagnosis not present

## 2023-05-19 DIAGNOSIS — L570 Actinic keratosis: Secondary | ICD-10-CM | POA: Diagnosis not present

## 2023-05-19 DIAGNOSIS — L814 Other melanin hyperpigmentation: Secondary | ICD-10-CM | POA: Diagnosis not present

## 2023-05-19 DIAGNOSIS — D225 Melanocytic nevi of trunk: Secondary | ICD-10-CM | POA: Diagnosis not present

## 2023-05-19 DIAGNOSIS — Z85828 Personal history of other malignant neoplasm of skin: Secondary | ICD-10-CM | POA: Diagnosis not present

## 2023-05-19 DIAGNOSIS — L820 Inflamed seborrheic keratosis: Secondary | ICD-10-CM | POA: Diagnosis not present

## 2023-05-20 DIAGNOSIS — F411 Generalized anxiety disorder: Secondary | ICD-10-CM | POA: Diagnosis not present

## 2023-05-20 DIAGNOSIS — I10 Essential (primary) hypertension: Secondary | ICD-10-CM | POA: Diagnosis not present

## 2023-06-09 ENCOUNTER — Ambulatory Visit: Admitting: Physician Assistant

## 2023-06-09 ENCOUNTER — Encounter: Payer: Self-pay | Admitting: Physician Assistant

## 2023-06-09 DIAGNOSIS — F331 Major depressive disorder, recurrent, moderate: Secondary | ICD-10-CM

## 2023-06-09 DIAGNOSIS — F411 Generalized anxiety disorder: Secondary | ICD-10-CM | POA: Diagnosis not present

## 2023-06-09 MED ORDER — FLUVOXAMINE MALEATE 100 MG PO TABS
250.0000 mg | ORAL_TABLET | Freq: Every day | ORAL | 1 refills | Status: DC
Start: 2023-06-09 — End: 2023-11-10

## 2023-06-09 NOTE — Progress Notes (Signed)
 [facility]  Patient ID: Madison Carr,  MRN: [id number]  PCP: Ira Mann  Date of Evaluation: 06/09/2023 Time spent:20 minutes  Chief Complaint:  Chief Complaint   Anxiety; Depression; Follow-up    HISTORY/CURRENT STATUS: HPI For routine med check  Has been more anxious lately. Not sure why. She feels nervous and on edge, like something bad is going to happen a lot of the time.  Not having panic attacks with shortness of breath, chest tightness or palpitations, and no sweating.  She takes usually 1 Ativan  at night to help her relax to go to sleep but lately she has taken 1/2 pill during the day at times because the anxiety is so bad.  It helps a little bit, not enough though.  She also feels kind of depressed.  She does things with friends, has a Bible study at her house every Monday night, she plays tennis, spends time with her family and grandkids but she has trouble being motivated to do those things.  She has to push herself.  Appetite is normal and weight is stable.  ADLs and personal hygiene are normal.  No feelings of hopelessness.  No easy crying.  She is retired.  She sleeps well with the Ativan .  Memory is about the same, no worse.  She is able to focus and stay on task.  Denies suicidal or homicidal thoughts.  Patient denies increased energy with decreased need for sleep, increased talkativeness, racing thoughts, impulsivity or risky behaviors, increased spending, increased libido, grandiosity, increased irritability or anger, paranoia, or hallucinations.  Denies dizziness, syncope, seizures, numbness, tingling, tremor, tics, unsteady gait, slurred speech, confusion. Denies muscle or joint pain, stiffness, or dystonia.   Individual Medical History/ Review of Systems: Changes? :No     Past Psychiatric History:    No mental health hospitalizations. No suicide attempts. No self harm.    Past medications for mental health diagnoses include: Elavil ,  Ativan , Trazodone  only for a little while, was not effective.  Allergies: Patient has no known allergies.  Current Medications:  Current Outpatient Medications:    amitriptyline  (ELAVIL ) 10 MG tablet, Take 1 tablet (10 mg total) by mouth at bedtime., Disp: 90 tablet, Rfl: 1   Ascorbic Acid (VITAMIN C) 500 MG CAPS, Take 1 capsule by mouth daily., Disp: , Rfl:    Calcium Carbonate-Vitamin D3 600-400 MG-UNIT TABS, 1 tablet with food, Disp: , Rfl:    CALCIUM-MAGNESIUM-ZINC PO, Take by mouth. Calcium 1000mg  Magnesium 400mg   Zinc 25mg , Disp: , Rfl:    cholecalciferol (VITAMIN D ) 1000 UNITS tablet, Take 1,000 Units by mouth daily., Disp: , Rfl:    hydrochlorothiazide  (HYDRODIURIL ) 12.5 MG tablet, Take 1 tablet (12.5 mg total) by mouth daily. (Patient taking differently: Take 25 mg by mouth daily.), Disp: 90 tablet, Rfl: 1   LORazepam  (ATIVAN ) 1 MG tablet, Take 1 tablet (1 mg total) by mouth 2 (two) times daily as needed for anxiety., Disp: 60 tablet, Rfl: 5   Multiple Vitamins-Minerals (OCUVITE PO), See admin instructions., Disp: , Rfl:    Multiple Vitamins-Minerals (ZINC PO), Take 1 tablet by mouth daily., Disp: , Rfl:    YUVAFEM  10 MCG TABS vaginal tablet, PLACE 1 TABLET VAGINALLY 2 TIMES A WEEK., Disp: 24 tablet, Rfl: 4   fluvoxaMINE  (LUVOX ) 100 MG tablet, Take 2.5 tablets (250 mg total) by mouth at bedtime., Disp: 225 tablet, Rfl: 1 Medication Side Effects: none  Family Medical/ Social History: Changes? No  MENTAL HEALTH EXAM:  Last  menstrual period 11/22/2003.There is no height or weight on file to calculate BMI.  General Appearance: Casual and Well Groomed  Eye Contact:  Good  Speech:  Clear and Coherent and Normal Rate  Volume:  Normal  Mood:  Anxious  Affect:  Anxious  Thought Process:  Goal Directed and Descriptions of Associations: Circumstantial  Orientation:  Full (Time, Place, and Person)  Thought Content: Logical   Suicidal Thoughts:  No  Homicidal Thoughts:  No  Memory:   Immediate;   Fair Recent;   Fair Remote;   Good  Judgement:  Good  Insight:  Good  Psychomotor Activity:  Normal  Concentration:  Concentration: Good and Attention Span: Good  Recall:  Good  Fund of Knowledge: Good  Language: Good  Assets:  Desire for Improvement Financial Resources/Insurance Housing Transportation  ADL's:  Intact  Cognition: WNL  Prognosis:  Good   DIAGNOSES:    ICD-10-CM   1. Generalized anxiety disorder  F41.1     2. Major depressive disorder, recurrent episode, moderate (HCC)  F33.1       Receiving Psychotherapy: No  with Mansfield Seip Deussing in the past.  RECOMMENDATIONS:  PDMP reviewed.  Ativan  filled 05/19/2023. I provided 20 minutes of face to face time during this encounter, including time spent before and after the visit in records review, medical decision making, counseling pertinent to today's visit, and charting.   We discussed the anxiety.  I recommend increasing the Luvox .  The usual maximum FDA dose is 200 mg, however with extreme anxiety or depression increasing to 300 or even 400 mg in rare cases are appropriate.  We will increase to 250 mg.  This should help prevent the anxiety.  Advised her to take an Ativan  during the day when needed, just no more than 2 pills/day.  She verbalizes understanding.  Continue amitriptyline  10 mg, 1 p.o. nightly. Increase Luvox  100 mg, to 2.5  p.o. nightly.   Continue Ativan  1 mg, to 1 p.o. twice daily as needed.  She will take the evening dose routinely as usual. Continue multivitamin, vitamin D , fish oil, B12, CoQ 10. Restart therapy with Jessie Deussing. Return in 6-8 weeks.   Marvia Slocumb, PA-C

## 2023-07-22 DIAGNOSIS — I10 Essential (primary) hypertension: Secondary | ICD-10-CM | POA: Diagnosis not present

## 2023-07-22 DIAGNOSIS — R413 Other amnesia: Secondary | ICD-10-CM | POA: Diagnosis not present

## 2023-07-28 ENCOUNTER — Encounter: Payer: Self-pay | Admitting: Physician Assistant

## 2023-07-28 ENCOUNTER — Ambulatory Visit: Admitting: Physician Assistant

## 2023-07-28 DIAGNOSIS — R413 Other amnesia: Secondary | ICD-10-CM

## 2023-07-28 DIAGNOSIS — G4700 Insomnia, unspecified: Secondary | ICD-10-CM | POA: Diagnosis not present

## 2023-07-28 DIAGNOSIS — F411 Generalized anxiety disorder: Secondary | ICD-10-CM

## 2023-07-28 DIAGNOSIS — F329 Major depressive disorder, single episode, unspecified: Secondary | ICD-10-CM

## 2023-07-28 MED ORDER — LORAZEPAM 1 MG PO TABS: 1.0000 mg | ORAL_TABLET | Freq: Two times a day (BID) | ORAL | 5 refills | Status: DC | PRN

## 2023-07-28 NOTE — Progress Notes (Signed)
 [facility]  Patient ID: Madison Carr,  MRN: [id number]  PCP: Madison Carr  Date of Evaluation: 07/28/2023 Time spent:20 minutes  Chief Complaint:  Chief Complaint   Anxiety; Depression    HISTORY/CURRENT STATUS: HPI For routine med check  We increased the Luvox  at the LOV. Anxiety seems a little better. Still needs the Ativan  when she feels 'like a knot' in her stomach, when she's anxious. It is effective. She is able to enjoy things.  Energy and motivation are good.  No extreme sadness, tearfulness, or feelings of hopelessness.  Sleeps well most of the time. ADLs and personal hygiene are normal.  Still has issues with memory, 'what did I go in this room for? Or what is that person's name? Even thinking about an object is hard sometimes, like the name of it.' Saw her PCP recently.  MMSE was great, only missed the math question. Appetite has not changed.  Weight is stable.  No mania, psychosis, or delirium.  Denies suicidal or homicidal thoughts.  Denies dizziness, syncope, seizures, numbness, tingling, tremor, tics, unsteady gait, slurred speech, confusion. Denies muscle or joint pain, stiffness, or dystonia.  Individual Medical History/ Review of Systems: Changes? :Yes    having right cataract surgery tomorrow,   Past Psychiatric History:    No mental health hospitalizations. No suicide attempts. No self harm.    Past medications for mental health diagnoses include: Elavil , Ativan , Trazodone  only for a little while, was not effective.  Allergies: Patient has no known allergies.  Current Medications:  Current Outpatient Medications:    amitriptyline  (ELAVIL ) 10 MG tablet, Take 1 tablet (10 mg total) by mouth at bedtime., Disp: 90 tablet, Rfl: 1   Ascorbic Acid (VITAMIN C) 500 MG CAPS, Take 1 capsule by mouth daily., Disp: , Rfl:    Calcium Carbonate-Vitamin D3 600-400 MG-UNIT TABS, 1 tablet with food, Disp: , Rfl:    CALCIUM-MAGNESIUM-ZINC PO, Take by  mouth. Calcium 1000mg  Magnesium 400mg   Zinc 25mg , Disp: , Rfl:    cholecalciferol (VITAMIN D ) 1000 UNITS tablet, Take 1,000 Units by mouth daily., Disp: , Rfl:    fluvoxaMINE  (LUVOX ) 100 MG tablet, Take 2.5 tablets (250 mg total) by mouth at bedtime., Disp: 225 tablet, Rfl: 1   hydrochlorothiazide  (HYDRODIURIL ) 12.5 MG tablet, Take 1 tablet (12.5 mg total) by mouth daily. (Patient taking differently: Take 25 mg by mouth daily.), Disp: 90 tablet, Rfl: 1   LORazepam  (ATIVAN ) 1 MG tablet, Take 1 tablet (1 mg total) by mouth 2 (two) times daily as needed for anxiety., Disp: 60 tablet, Rfl: 5   Multiple Vitamins-Minerals (OCUVITE PO), See admin instructions., Disp: , Rfl:    Multiple Vitamins-Minerals (ZINC PO), Take 1 tablet by mouth daily., Disp: , Rfl:    YUVAFEM  10 MCG TABS vaginal tablet, PLACE 1 TABLET VAGINALLY 2 TIMES A WEEK., Disp: 24 tablet, Rfl: 4 Medication Side Effects: none  Family Medical/ Social History: Changes? No  MENTAL HEALTH EXAM:  Last menstrual period 11/22/2003.There is no height or weight on file to calculate BMI.  General Appearance: Casual and Well Groomed  Eye Contact:  Good  Speech:  Clear and Coherent and Normal Rate  Volume:  Normal  Mood:  Euthymic  Affect:  Congruent  Thought Process:  Goal Directed and Descriptions of Associations: Circumstantial  Orientation:  Full (Time, Place, and Person)  Thought Content: Logical   Suicidal Thoughts:  No  Homicidal Thoughts:  No  Memory:  Immediate;   Fair Recent;  Fair Remote;   Good  Judgement:  Good  Insight:  Good  Psychomotor Activity:  Normal  Concentration:  Concentration: Good and Attention Span: Good  Recall:  Good  Fund of Knowledge: Good  Language: Good  Assets:  Desire for Improvement Financial Resources/Insurance Housing Transportation  ADL's:  Intact  Cognition: WNL  Prognosis:  Good   DIAGNOSES:    ICD-10-CM   1. Generalized anxiety disorder  F41.1     2. Reactive depression  F32.9      3. Insomnia, unspecified type  G47.00     4. Memory loss  R41.3       Receiving Psychotherapy: No  with Madison Carr in the past.  RECOMMENDATIONS:  PDMP reviewed.  Ativan  filled 07/21/2023. I provided approximately 20 minutes of face to face time during this encounter, including time spent before and after the visit in records review, medical decision making, counseling pertinent to today's visit, and charting.   We briefly discussed her issues with memory.  I am not terribly concerned about it at this point, with recent Mini-Mental status exam normal.  However a neurology consult might be warranted.  She will think it over, talked to her family and see what they say.  She can call if she would like for us  to go ahead with a referral.  I recommend Dr. Onetha Carr.  Continue amitriptyline  10 mg, 1 p.o. nightly. Continue Luvox  100 mg, 2.5  p.o. nightly.   Continue Ativan  1 mg, to 1 p.o. twice daily as needed.  She will take the evening dose routinely as usual. Continue multivitamin, vitamin D , fish oil, B12, CoQ 10. Return in 3 months.  Madison Cooks, PA-C

## 2023-07-29 DIAGNOSIS — H2511 Age-related nuclear cataract, right eye: Secondary | ICD-10-CM | POA: Diagnosis not present

## 2023-07-29 DIAGNOSIS — Z961 Presence of intraocular lens: Secondary | ICD-10-CM | POA: Diagnosis not present

## 2023-07-29 DIAGNOSIS — H25811 Combined forms of age-related cataract, right eye: Secondary | ICD-10-CM | POA: Diagnosis not present

## 2023-07-29 DIAGNOSIS — H25041 Posterior subcapsular polar age-related cataract, right eye: Secondary | ICD-10-CM | POA: Diagnosis not present

## 2023-08-12 DIAGNOSIS — H2512 Age-related nuclear cataract, left eye: Secondary | ICD-10-CM | POA: Diagnosis not present

## 2023-08-12 DIAGNOSIS — H25812 Combined forms of age-related cataract, left eye: Secondary | ICD-10-CM | POA: Diagnosis not present

## 2023-08-12 DIAGNOSIS — Z961 Presence of intraocular lens: Secondary | ICD-10-CM | POA: Diagnosis not present

## 2023-09-24 ENCOUNTER — Other Ambulatory Visit: Payer: Self-pay | Admitting: Physician Assistant

## 2023-10-01 DIAGNOSIS — U071 COVID-19: Secondary | ICD-10-CM | POA: Diagnosis not present

## 2023-10-08 ENCOUNTER — Other Ambulatory Visit: Payer: Self-pay | Admitting: Physician Assistant

## 2023-10-27 ENCOUNTER — Other Ambulatory Visit: Payer: Self-pay | Admitting: Physician Assistant

## 2023-10-27 DIAGNOSIS — F411 Generalized anxiety disorder: Secondary | ICD-10-CM

## 2023-11-03 ENCOUNTER — Ambulatory Visit: Admitting: Physician Assistant

## 2023-11-03 DIAGNOSIS — M1711 Unilateral primary osteoarthritis, right knee: Secondary | ICD-10-CM | POA: Diagnosis not present

## 2023-11-10 ENCOUNTER — Encounter: Payer: Self-pay | Admitting: Physician Assistant

## 2023-11-10 ENCOUNTER — Ambulatory Visit: Admitting: Physician Assistant

## 2023-11-10 DIAGNOSIS — F3342 Major depressive disorder, recurrent, in full remission: Secondary | ICD-10-CM | POA: Diagnosis not present

## 2023-11-10 DIAGNOSIS — G4700 Insomnia, unspecified: Secondary | ICD-10-CM

## 2023-11-10 DIAGNOSIS — F411 Generalized anxiety disorder: Secondary | ICD-10-CM

## 2023-11-10 MED ORDER — FLUVOXAMINE MALEATE 100 MG PO TABS
250.0000 mg | ORAL_TABLET | Freq: Every day | ORAL | 1 refills | Status: DC
Start: 2023-11-10 — End: 2023-11-28

## 2023-11-10 MED ORDER — LORAZEPAM 1 MG PO TABS: 1.0000 mg | ORAL_TABLET | Freq: Two times a day (BID) | ORAL | 5 refills | Status: DC | PRN

## 2023-11-10 NOTE — Progress Notes (Signed)
 [facility]  Patient ID: Madison Carr,  MRN: [id number]  PCP: Nena Rosina LITTIE DEVONNA  Date of Evaluation: 11/10/2023 Time spent:20 minutes  Chief Complaint:  Chief Complaint   Depression; Anxiety; Follow-up    HISTORY/CURRENT STATUS: HPI For routine med check  Doing well. Except for the physical issues, see ROS. She is able to enjoy things. Gets a lot of joy from her grandkids.  Energy and motivation are good.  No extreme sadness, tearfulness, or feelings of hopelessness.  Sleeps ok.  ADLs and personal hygiene are normal.  Memory issues is about the same. Mostly trouble remembering names or events if she doesn't write things down.  No probs getting lost with driving or things like that.  Appetite has not changed.  Weight is stable.  Anxiety is well controlled with Ativan .  No mania, delirium, AH/VH.  No SI/HI.  Individual Medical History/ Review of Systems: Changes? 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  Had cataract surgery since LOV  Past Psychiatric History:    No mental health hospitalizations. No suicide attempts. No self harm.    Past medications for mental health diagnoses include: Elavil , Ativan , Trazodone  only for a little while, was not effective.  Allergies: Patient has no known allergies.  Current Medications:  Current Outpatient Medications:    amitriptyline  (ELAVIL ) 10 MG tablet, TAKE 1 TABLET BY MOUTH EVERYDAY AT BEDTIME, Disp: 90 tablet, Rfl: 1   Ascorbic Acid (VITAMIN C) 500 MG CAPS, Take 1 capsule by mouth daily., Disp: , Rfl:    Calcium Carbonate-Vitamin D3 600-400 MG-UNIT TABS, 1 tablet with food, Disp: , Rfl:    CALCIUM-MAGNESIUM-ZINC PO, Take by mouth. Calcium 1000mg  Magnesium 400mg   Zinc 25mg , Disp: , Rfl:    cholecalciferol (VITAMIN D ) 1000 UNITS tablet, Take 1,000 Units by mouth daily., Disp: , Rfl:    Multiple Vitamins-Minerals (OCUVITE PO), See admin instructions., Disp: , Rfl:     Multiple Vitamins-Minerals (ZINC PO), Take 1 tablet by mouth daily., Disp: , Rfl:    YUVAFEM  10 MCG TABS vaginal tablet, PLACE 1 TABLET VAGINALLY 2 TIMES A WEEK., Disp: 24 tablet, Rfl: 4   fluvoxaMINE  (LUVOX ) 100 MG tablet, Take 2.5 tablets (250 mg total) by mouth at bedtime., Disp: 225 tablet, Rfl: 1   hydrochlorothiazide  (HYDRODIURIL ) 12.5 MG tablet, Take 1 tablet (12.5 mg total) by mouth daily. (Patient taking differently: Take 25 mg by mouth daily.), Disp: 90 tablet, Rfl: 1   LORazepam  (ATIVAN ) 1 MG tablet, Take 1 tablet (1 mg total) by mouth 2 (two) times daily as needed for anxiety., Disp: 60 tablet, Rfl: 5 Medication Side Effects: none  Family Medical/ Social History: Changes?  Her MIL recently died. She was 73 years old.   MENTAL HEALTH EXAM:  Last menstrual period 11/22/2003.There is no height or weight on file to calculate BMI.  General Appearance: Casual and Well Groomed  Eye Contact:  Good  Speech:  Clear and Coherent and Normal Rate  Volume:  Normal  Mood:  Euthymic  Affect:  Congruent  Thought Process:  Goal Directed and Descriptions of Associations: Circumstantial  Orientation:  Full (Time, Place, and Person)  Thought Content: Logical   Suicidal Thoughts:  No  Homicidal Thoughts:  No  Memory:  Immediate;   Fair Recent;   Fair Remote;   Good  Judgement:  Good  Insight:  Good  Psychomotor Activity:  Normal  Concentration:  Concentration: Good and  Attention Span: Good  Recall:  Good  Fund of Knowledge: Good  Language: Good  Assets:  Communication Skills Desire for Improvement Financial Resources/Insurance Housing Transportation  ADL's:  Intact  Cognition: WNL  Prognosis:  Good   DIAGNOSES:    ICD-10-CM   1. Recurrent major depression in full remission  F33.42     2. Generalized anxiety disorder  F41.1 LORazepam  (ATIVAN ) 1 MG tablet    3. Insomnia, unspecified type  G47.00      Receiving Psychotherapy: No  with Albino Deussing in the  past.  RECOMMENDATIONS:  PDMP reviewed.  Ativan  filled 10/28/2023. Hydrocodone  given 10/01/2023.  I provided approximately 20 minutes of face to face time during this encounter, including time spent before and after the visit in records review, medical decision making, counseling pertinent to today's visit, and charting.   Birdena is doing well on the current meds so no changes are needed.  Continue amitriptyline  10 mg, 1 p.o. nightly. Continue Luvox  100 mg, 2.5  p.o. nightly.   Continue Ativan  1 mg, to 1 p.o. twice daily as needed.  She will take the evening dose routinely as usual. Continue multivitamin, vitamin D , fish oil, B12, CoQ 10. Return in 4 months.  Verneita Cooks, PA-C

## 2023-11-25 ENCOUNTER — Other Ambulatory Visit: Payer: Self-pay | Admitting: Physician Assistant

## 2023-11-25 DIAGNOSIS — F411 Generalized anxiety disorder: Secondary | ICD-10-CM

## 2023-11-28 ENCOUNTER — Other Ambulatory Visit: Payer: Self-pay

## 2023-11-28 ENCOUNTER — Telehealth: Payer: Self-pay | Admitting: Physician Assistant

## 2023-11-28 DIAGNOSIS — F411 Generalized anxiety disorder: Secondary | ICD-10-CM

## 2023-11-28 MED ORDER — LORAZEPAM 1 MG PO TABS: ORAL_TABLET | ORAL | 0 refills | Status: DC

## 2023-11-28 MED ORDER — AMITRIPTYLINE HCL 10 MG PO TABS: 10.0000 mg | ORAL_TABLET | Freq: Every day | ORAL | 0 refills | Status: AC

## 2023-11-28 MED ORDER — FLUVOXAMINE MALEATE 100 MG PO TABS
250.0000 mg | ORAL_TABLET | Freq: Every day | ORAL | 0 refills | Status: AC
Start: 2023-11-28 — End: ?

## 2023-11-28 NOTE — Telephone Encounter (Signed)
 Pt Lvm @ 9:53a stating she has gone out of town for 4 days and left her medications.  She is asking if she can get script sent for Lorazepam , Amitriptyline  and Fluvoxamine  to for the time she is gone.  [facility] [address] [place], [place] [address] [phone]  Next appt 2/23

## 2023-11-28 NOTE — Telephone Encounter (Signed)
 Pended fluvoxamine  and amitriptyline  for 4 days to [facility] as requested. Pended lorazepam .

## 2023-12-05 ENCOUNTER — Telehealth: Payer: Self-pay | Admitting: Physician Assistant

## 2023-12-05 DIAGNOSIS — I10 Essential (primary) hypertension: Secondary | ICD-10-CM | POA: Diagnosis not present

## 2023-12-05 DIAGNOSIS — F411 Generalized anxiety disorder: Secondary | ICD-10-CM | POA: Diagnosis not present

## 2023-12-05 DIAGNOSIS — R251 Tremor, unspecified: Secondary | ICD-10-CM | POA: Diagnosis not present

## 2023-12-05 NOTE — Telephone Encounter (Signed)
 Patient lvm at 2:16 stating that she saw her PCP today and her blood pressure was up and has being experiencing some anxiety. PCP asked could this be due to combination of Amitriptyline  10mg  and her medication she takes a night she was unsure of the name but says it starts with M. Otherwise she is doing well and no concerns, Pls rtc [phone]

## 2023-12-07 NOTE — Telephone Encounter (Signed)
 Called 11/14 and she reports she has had a lot of things going on that could be contributing to increased anxiety and elevated BP.

## 2023-12-10 NOTE — Telephone Encounter (Signed)
 Pt was seen by PCP 11/14 and reported BP 140/70, pulse 84. Reviewed notes from [facility] and BP 7/1 was 104/62, 81; 4/29 122/76, 81. She was asking if fluvoxamine  or amitriptyline  could be causing the elevation. She said she is doing well otherwise. She does report a lot of situational issues that could also be causing the elevation.

## 2023-12-10 NOTE — Telephone Encounter (Signed)
 I don't think it's the meds. The fluvoxamine  and amitriptyline  doses have been the same since 11/05/2021.

## 2023-12-11 NOTE — Telephone Encounter (Signed)
 LVM per DPR with feedback.

## 2024-02-17 ENCOUNTER — Telehealth: Payer: Self-pay | Admitting: Physician Assistant

## 2024-02-17 ENCOUNTER — Other Ambulatory Visit: Payer: Self-pay

## 2024-02-17 ENCOUNTER — Other Ambulatory Visit: Payer: Self-pay | Admitting: Physician Assistant

## 2024-02-17 DIAGNOSIS — F411 Generalized anxiety disorder: Secondary | ICD-10-CM

## 2024-02-17 MED ORDER — LORAZEPAM 1 MG PO TABS: ORAL_TABLET | ORAL | 0 refills | Status: AC

## 2024-02-17 NOTE — Telephone Encounter (Signed)
 Sorry, I didn't know they wouldn't allow that! Thanks for pending it. I sent it in

## 2024-02-17 NOTE — Telephone Encounter (Signed)
 Madison Carr, please see message:  Patient lvm requesting additional pills on her Lorazepam . She is leaving for vacation on 2/1 and will be gone 18 days. That is not be enough to sustain her while she is away. She takes 2 a day, and is aware it's too early for the refill. That is why she is requesting only enough for the duration of the trip.

## 2024-02-17 NOTE — Telephone Encounter (Signed)
 Patient lvm requesting additional pills on her Lorazepam . She is leaving for vacation on 2/1 and will be gone 18 days. That is not be enough to sustain her while she is away. She takes 2 a day, and is aware it's too early for the refill. That is why she is requesting only enough for the duration of the trip.

## 2024-02-17 NOTE — Telephone Encounter (Signed)
 Please call the pharmacy and ask them to release an early RF b/c she'll be on vacation when it's due on 02/29/2024. The RF won't be available until 03/27/2024 though.

## 2024-02-17 NOTE — Telephone Encounter (Signed)
 Called pharmacy, they said you have to send a RX for the amount of pills and it has to say on it for vacation. They would not let me do it over the phone. I will pend it to you.

## 2024-03-15 ENCOUNTER — Ambulatory Visit: Admitting: Physician Assistant

## 2024-03-17 ENCOUNTER — Ambulatory Visit: Admitting: Physician Assistant
